# Patient Record
Sex: Male | Born: 1985 | Race: White | Hispanic: No | Smoking: Current every day smoker
Health system: Southern US, Community
[De-identification: ages and names within clinical notes are randomized; demographics above are authoritative.]

## PROBLEM LIST (undated history)

## (undated) DIAGNOSIS — Z8614 Personal history of Methicillin resistant Staphylococcus aureus infection: Secondary | ICD-10-CM

## (undated) DIAGNOSIS — F8081 Childhood onset fluency disorder: Secondary | ICD-10-CM

## (undated) DIAGNOSIS — F909 Attention-deficit hyperactivity disorder, unspecified type: Secondary | ICD-10-CM

## (undated) DIAGNOSIS — F32A Depression, unspecified: Secondary | ICD-10-CM

## (undated) DIAGNOSIS — R51 Headache: Secondary | ICD-10-CM

## (undated) DIAGNOSIS — F419 Anxiety disorder, unspecified: Secondary | ICD-10-CM

## (undated) HISTORY — PX: EYE SURGERY: SHX253

## (undated) HISTORY — PX: NO PAST SURGERIES: SHX2092

---
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
## 2006-08-06 DIAGNOSIS — Z8614 Personal history of Methicillin resistant Staphylococcus aureus infection: Secondary | ICD-10-CM

## 2006-08-06 HISTORY — DX: Personal history of Methicillin resistant Staphylococcus aureus infection: Z86.14

## 2007-05-09 ENCOUNTER — Emergency Department (HOSPITAL_COMMUNITY): Admission: EM | Admit: 2007-05-09 | Discharge: 2007-05-09 | Payer: Self-pay | Admitting: Emergency Medicine

## 2008-08-06 DIAGNOSIS — A159 Respiratory tuberculosis unspecified: Secondary | ICD-10-CM

## 2008-08-06 HISTORY — DX: Respiratory tuberculosis unspecified: A15.9

## 2008-12-24 ENCOUNTER — Emergency Department (HOSPITAL_COMMUNITY): Admission: EM | Admit: 2008-12-24 | Discharge: 2008-12-24 | Payer: Self-pay | Admitting: Emergency Medicine

## 2008-12-26 ENCOUNTER — Emergency Department (HOSPITAL_COMMUNITY): Admission: EM | Admit: 2008-12-26 | Discharge: 2008-12-26 | Payer: Self-pay | Admitting: Emergency Medicine

## 2009-03-17 ENCOUNTER — Emergency Department (HOSPITAL_COMMUNITY): Admission: EM | Admit: 2009-03-17 | Discharge: 2009-03-17 | Payer: Self-pay | Admitting: Emergency Medicine

## 2009-05-28 ENCOUNTER — Encounter: Payer: Self-pay | Admitting: Orthopedic Surgery

## 2009-05-28 ENCOUNTER — Emergency Department (HOSPITAL_COMMUNITY): Admission: EM | Admit: 2009-05-28 | Discharge: 2009-05-28 | Payer: Self-pay | Admitting: Emergency Medicine

## 2009-05-30 ENCOUNTER — Ambulatory Visit: Payer: Self-pay | Admitting: Orthopedic Surgery

## 2009-05-30 DIAGNOSIS — S43109A Unspecified dislocation of unspecified acromioclavicular joint, initial encounter: Secondary | ICD-10-CM | POA: Insufficient documentation

## 2009-05-30 DIAGNOSIS — S40019A Contusion of unspecified shoulder, initial encounter: Secondary | ICD-10-CM | POA: Insufficient documentation

## 2009-06-20 ENCOUNTER — Ambulatory Visit: Payer: Self-pay | Admitting: Orthopedic Surgery

## 2009-08-03 ENCOUNTER — Encounter: Payer: Self-pay | Admitting: Orthopedic Surgery

## 2009-10-12 ENCOUNTER — Emergency Department (HOSPITAL_COMMUNITY): Admission: EM | Admit: 2009-10-12 | Discharge: 2009-10-12 | Payer: Self-pay | Admitting: Emergency Medicine

## 2010-06-03 ENCOUNTER — Emergency Department (HOSPITAL_COMMUNITY): Admission: EM | Admit: 2010-06-03 | Discharge: 2010-06-03 | Payer: Self-pay | Admitting: Emergency Medicine

## 2010-08-27 ENCOUNTER — Encounter: Payer: Self-pay | Admitting: Orthopedic Surgery

## 2010-12-22 NOTE — Discharge Summary (Signed)
[facility]  Patient:    Austin Bryant, Austin Bryant                     MRN: [id number] Adm. Date:  [id number] Disc. Date: [id number] Attending:  Jasmine Pang                           Discharge Summary  NO TEXT DD:  08/12/00 TD:  08/12/00 Job: [phone] [id number]
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
## 2010-12-22 NOTE — Discharge Summary (Signed)
[facility]  Patient:    Austin Bryant, Austin Bryant                     MRN: [id number] Adm. Date:  [id number] Disc. Date: [id number] Attending:  Milford Cage H                           Discharge Summary  REASON FOR ADMISSION:  The patient is a 25 year old male who was admitted after reportedly making threats to kill his family members.  He states that he did not mean to harm them, but they took it seriously.  For further psychiatric admission information, see Psychiatric Admission Assessment.  PHYSICAL EXAMINATION:  No abnormal physical findings.  LABORATORY DATA:  Laboratories in the chart included a hepatic profile which was within normal limits, a TSH which was within normal limits, a free T4 which was grossly within normal limits, and a urinalysis which was within normal limits.  HOSPITAL COURSE:  Upon admission Austin Bryant was given Vistaril 50 mg as a p.r.n. dose if needed for insomnia.  Otherwise, he was not initially placed on any medications.  On the day of discharge his mother expressed some concern about him leaving the hospital without medicine to help his aggressive behaviors. At that point the was started on Zyprexa 2.5 mg p.o. q.h.s.  He participated appropriately in unit therapeutic group and activities and was not a significant behavioral problem while on the unit.  His mother attended a family session for further guidance as to where to proceed from here.  She will continue to seek help at the [facility] for her son.  DISCHARGE MENTAL STATUS EXAMINATION:  The patients mood had improved.  He was not depressed or anxious.  He was angry or irritable.  There was no suicidal or homicidal ideation.  There was no psychosis.  DISCHARGE DIAGNOSES: Axis I:    1. Mood disorder, not otherwise specified.            2. Disruptive behavior disorder, not otherwise specified.            3. ADHD, combined type. Axis II:    None. Axis III:  Healthy. Axis IV:   Healthy. Axis V:    Global Assessment of Functioning is 60/50.  DISCHARGE MEDICATION:  Zyprexa 2.5 mg p.o. q.h.s.  POSTHOSPITAL CARE PLANS:  The patient will return home to live with family.  DISCHARGE FOLLOWUP:  Follow-up therapy and medication management will be at the [facility] with Lynden Ang, M.D. and Faythe Ghee, therapist (to see Austin Bryant on August 08, 2000, at 1:30 p.m.).  DISCHARGE INSTRUCTIONS:  Diet:  No restrictions.  Activity level:  No restrictions. DD:  08/12/00 TD:  08/12/00 Job: [id number]

## 2011-09-09 ENCOUNTER — Emergency Department (HOSPITAL_COMMUNITY)
Admission: EM | Admit: 2011-09-09 | Discharge: 2011-09-10 | Disposition: A | Discharge status: Home / Self Care | Payer: Self-pay | Attending: Emergency Medicine | Admitting: Emergency Medicine

## 2011-09-09 ENCOUNTER — Encounter (HOSPITAL_COMMUNITY): Payer: Self-pay | Admitting: *Deleted

## 2011-09-09 DIAGNOSIS — R109 Unspecified abdominal pain: Secondary | ICD-10-CM | POA: Insufficient documentation

## 2011-09-09 DIAGNOSIS — R3911 Hesitancy of micturition: Secondary | ICD-10-CM | POA: Insufficient documentation

## 2011-09-09 DIAGNOSIS — R1030 Lower abdominal pain, unspecified: Secondary | ICD-10-CM

## 2011-09-09 DIAGNOSIS — R300 Dysuria: Secondary | ICD-10-CM | POA: Insufficient documentation

## 2011-09-09 NOTE — ED Notes (Addendum)
Pt reports x2 days he has been experiencing dysuria - pt states today he began having rt groin pain that radiates to his penis. Pt states pain has gotten progressively worse. Pt denies any n/v/d, penile d/c or fever. Pt guarding rt groin and grimacing during assessment. Pt admits to unprotected sex w/ only one partner.

## 2011-09-09 NOTE — ED Notes (Signed)
C/o burning with urination x 2 days and blood in urine; reports onset of RLQ pain tonight.

## 2011-09-10 ENCOUNTER — Emergency Department (HOSPITAL_COMMUNITY): Payer: Self-pay

## 2011-09-10 LAB — CBC
MCH: 32.5 pg (ref 26.0–34.0)
MCHC: 33.7 g/dL (ref 30.0–36.0)
MCV: 96.7 fL (ref 78.0–100.0)
Platelets: 232 10*3/uL (ref 150–400)
RBC: 4.24 MIL/uL (ref 4.22–5.81)
RDW: 12.6 % (ref 11.5–15.5)
WBC: 8.9 10*3/uL (ref 4.0–10.5)

## 2011-09-10 LAB — URINALYSIS, ROUTINE W REFLEX MICROSCOPIC
Bilirubin Urine: NEGATIVE
Glucose, UA: NEGATIVE mg/dL
Hgb urine dipstick: NEGATIVE
Leukocytes, UA: NEGATIVE
Nitrite: NEGATIVE
Protein, ur: NEGATIVE mg/dL
Specific Gravity, Urine: 1.025 (ref 1.005–1.030)
Urobilinogen, UA: 0.2 mg/dL (ref 0.0–1.0)
pH: 7 (ref 5.0–8.0)

## 2011-09-10 LAB — BASIC METABOLIC PANEL
BUN: 14 mg/dL (ref 6–23)
Chloride: 102 mEq/L (ref 96–112)
Creatinine, Ser: 0.92 mg/dL (ref 0.50–1.35)
GFR calc Af Amer: >90 mL/min (ref >90)
GFR calc non Af Amer: >90 mL/min (ref >90)
Potassium: 4 mEq/L (ref 3.5–5.1)
Sodium: 136 mEq/L (ref 135–145)

## 2011-09-10 MED ORDER — HYDROMORPHONE HCL PF 2 MG/ML IJ SOLN
INTRAMUSCULAR | Status: AC
  Filled 2011-09-10: qty 1

## 2011-09-10 MED ORDER — IBUPROFEN 800 MG PO TABS
800.0000 mg | ORAL_TABLET | Freq: Once | ORAL | Status: AC
  Administered 2011-09-10: 800 mg via ORAL
  Filled 2011-09-10: qty 1

## 2011-09-10 MED ORDER — HYDROMORPHONE HCL PF 1 MG/ML IJ SOLN
1.0000 mg | Freq: Once | INTRAMUSCULAR | Status: AC
  Administered 2011-09-10: 1 mg via INTRAVENOUS
  Filled 2011-09-10: qty 1

## 2011-09-10 MED ORDER — ONDANSETRON HCL 4 MG/2ML IJ SOLN
4.0000 mg | Freq: Once | INTRAMUSCULAR | Status: AC
  Administered 2011-09-10: 4 mg via INTRAVENOUS
  Filled 2011-09-10: qty 2

## 2011-09-10 MED ORDER — HYDROMORPHONE HCL PF 2 MG/ML IJ SOLN: 2.0000 mg | Freq: Once | INTRAMUSCULAR | Status: DC

## 2011-09-10 MED ORDER — NAPROXEN 500 MG PO TABS: 500.0000 mg | ORAL_TABLET | Freq: Two times a day (BID) | ORAL | Status: DC

## 2011-09-10 MED ORDER — HYDROCODONE-ACETAMINOPHEN 5-325 MG PO TABS: 1.0000 | ORAL_TABLET | Freq: Four times a day (QID) | ORAL | Status: DC | PRN

## 2011-09-10 MED ORDER — SODIUM CHLORIDE 0.9 % IV SOLN
INTRAVENOUS | Status: DC
  Administered 2011-09-10: 02:00:00 via INTRAVENOUS

## 2011-09-10 NOTE — ED Notes (Signed)
Rx given x2 D/c instructions reviewed w/ pt - pt denies any further questions or concerns at present.   

## 2011-09-10 NOTE — ED Provider Notes (Addendum)
History     CSN: [id number]  Arrival date & time 09/09/11  2307   First MD Initiated Contact with Patient 09/09/11 2340      Chief Complaint  Patient presents with  . Dysuria    (Consider location/radiation/quality/duration/timing/severity/associated sxs/prior treatment) Patient is a 26 y.o. male presenting with dysuria. The history is provided by the patient.  Dysuria  This is a new problem. The current episode started 2 days ago. The problem has been rapidly worsening. The quality of the pain is described as burning and stabbing. The pain is at a severity of 10/10. The pain is moderate. There has been no fever. He is sexually active. There is no history of pyelonephritis. Associated symptoms include hesitancy. Pertinent negatives include no chills, no nausea, no vomiting, no discharge, no frequency, no hematuria and no flank pain.   Also onset of dysuria 2 days ago and acute onset tonight of right-sided groin pain. No abnormal pain or flank pain no hematuria he is sexually active denies any discharge or lesions. No testicular pain. History reviewed. No pertinent past medical history.  History reviewed. No pertinent past surgical history.  No family history on file.  History  Substance Use Topics  . Smoking status: Former Games developer  . Smokeless tobacco: Not on file  . Alcohol Use: No      Review of Systems  Constitutional: Negative for fever and chills.  HENT: Negative for congestion, neck pain and neck stiffness.   Eyes: Negative for redness.  Respiratory: Negative for cough and shortness of breath.   Cardiovascular: Negative for chest pain.  Gastrointestinal: Positive for abdominal pain. Negative for nausea and vomiting.  Genitourinary: Positive for dysuria and hesitancy. Negative for frequency, hematuria, flank pain, discharge, scrotal swelling and testicular pain.  Musculoskeletal: Negative for back pain.  Skin: Negative for rash.  Neurological: Negative for headaches.    Hematological: Does not bruise/bleed easily.    Allergies  Penicillins  Home Medications   Current Outpatient Rx  Name Route Sig Dispense Refill  . IBUPROFEN 200 MG PO TABS Oral Take 200 mg by mouth every 6 (six) hours as needed.    Marland Kitchen HYDROCODONE-ACETAMINOPHEN 5-325 MG PO TABS Oral Take 1-2 tablets by mouth every 6 (six) hours as needed for pain. 10 tablet 0  . NAPROXEN 500 MG PO TABS Oral Take 1 tablet (500 mg total) by mouth 2 (two) times daily. 14 tablet 0    BP 137/86  Pulse 96  Temp(Src) 98.1 F (36.7 C) (Oral)  Resp 20  Ht 6\' 3"  (1.905 m)  Wt 246 lb (111.585 kg)  BMI 30.75 kg/m2  SpO2 98%  Physical Exam  Nursing note and vitals reviewed. Constitutional: He is oriented to person, place, and time. He appears well-developed and well-nourished. No distress.  HENT:  Head: Normocephalic and atraumatic.  Mouth/Throat: Oropharynx is clear and moist.  Eyes: Conjunctivae are normal. Pupils are equal, round, and reactive to light.  Neck: Normal range of motion. Neck supple.  Cardiovascular: Normal rate, regular rhythm and normal heart sounds.   No murmur heard. Pulmonary/Chest: Effort normal and breath sounds normal.  Abdominal: Soft. Bowel sounds are normal. He exhibits no mass. There is no tenderness.  Genitourinary: Penis normal.       No penile discharge no lesions no testicular mass no scrotal swelling no epididymal tenderness no groin hernia  Musculoskeletal: Normal range of motion. He exhibits no edema.  Neurological: He is alert and oriented to person, place, and time. No cranial  nerve deficit. He exhibits normal muscle tone. Coordination normal.  Skin: Skin is warm. No rash noted.    ED Course  Procedures (including critical care time)   Labs Reviewed  CBC  BASIC METABOLIC PANEL  URINALYSIS, ROUTINE W REFLEX MICROSCOPIC  URINALYSIS, WITH MICROSCOPIC   Ct Abdomen Pelvis Wo Contrast  09/10/2011  *RADIOLOGY REPORT*  Clinical Data: Dysuria  CT ABDOMEN AND  PELVIS WITHOUT CONTRAST  Technique:  Multidetector CT imaging of the abdomen and pelvis was performed following the standard protocol without intravenous contrast.  Comparison: None.  Findings: Limited images through the lung bases demonstrate no significant appreciable abnormality. The heart size is within normal limits. No pleural or pericardial effusion.  Intra-abdominal organ evaluation is limited without intravenous contrast.  Within this limitation, unremarkable liver, biliary system, spleen, pancreas, adrenal glands.  Symmetric renal size.  No hydronephrosis or hydroureter.  No urinary tract calculi identified.  No bowel obstruction.  No CT evidence for colitis.  Normal appendix.  No free intraperitoneal air or fluid.  No lymphadenopathy.  Normal caliber vasculature.  Decompressed bladder.  There is a round metallic density posterior to the L4 vertebral body.  No acute osseous abnormality identified.  IMPRESSION: No hydronephrosis or urinary tract calculi identified.  Normal appendix.  Original Report Authenticated By: Waneta Martins, M.D.   Results for orders placed during the hospital encounter of 09/09/11  CBC      Component Value Range   WBC 8.9  4.0 - 10.5 (K/uL)   RBC 4.24  4.22 - 5.81 (MIL/uL)   Hemoglobin 13.8  13.0 - 17.0 (g/dL)   HCT 08.6  57.8 - 46.9 (%)   MCV 96.7  78.0 - 100.0 (fL)   MCH 32.5  26.0 - 34.0 (pg)   MCHC 33.7  30.0 - 36.0 (g/dL)   RDW 62.9  52.8 - 41.3 (%)   Platelets 232  150 - 400 (K/uL)  BASIC METABOLIC PANEL      Component Value Range   Sodium 136  135 - 145 (mEq/L)   Potassium 4.0  3.5 - 5.1 (mEq/L)   Chloride 102  96 - 112 (mEq/L)   CO2 26  19 - 32 (mEq/L)   Glucose, Bld 97  70 - 99 (mg/dL)   BUN 14  6 - 23 (mg/dL)   Creatinine, Ser 2.44  0.50 - 1.35 (mg/dL)   Calcium 9.4  8.4 - 01.0 (mg/dL)   GFR calc non Af Amer >90  >90 (mL/min)   GFR calc Af Amer >90  >90 (mL/min)  URINALYSIS, ROUTINE W REFLEX MICROSCOPIC      Component Value Range   Color,  Urine YELLOW  YELLOW    APPearance CLEAR  CLEAR    Specific Gravity, Urine 1.025  1.005 - 1.030    pH 7.0  5.0 - 8.0    Glucose, UA NEGATIVE  NEGATIVE (mg/dL)   Hgb urine dipstick NEGATIVE  NEGATIVE    Bilirubin Urine NEGATIVE  NEGATIVE    Ketones, ur NEGATIVE  NEGATIVE (mg/dL)   Protein, ur NEGATIVE  NEGATIVE (mg/dL)   Urobilinogen, UA 0.2  0.0 - 1.0 (mg/dL)   Nitrite NEGATIVE  NEGATIVE    Leukocytes, UA NEGATIVE  NEGATIVE      1. Groin pain       MDM  Vision with complaint of bilateral groin pain mostly on the right side. Associated with dysuria started 2 days ago got worse this evening. No history of kidney stones are similar problem. Denies any  trauma or injury. CT scan negative for any abnormalities including renal stones or hernia. Based on exam no discharge so unlikely STD also testicular exam with no masses no epididymal tenderness. No evidence of testicular torsion. No leukocytosis. Should etiology the pain is not clear will recheck patient just to be sure no change in the testicular exam.        Shelda Jakes, MD 09/10/11 1191  Shelda Jakes, MD 09/10/11 [id number]

## 2011-09-15 ENCOUNTER — Emergency Department (HOSPITAL_COMMUNITY)
Admission: EM | Admit: 2011-09-15 | Discharge: 2011-09-15 | Disposition: A | Discharge status: Home / Self Care | Payer: Self-pay | Attending: Emergency Medicine | Admitting: Emergency Medicine

## 2011-09-15 ENCOUNTER — Emergency Department (HOSPITAL_COMMUNITY): Payer: Self-pay

## 2011-09-15 ENCOUNTER — Encounter (HOSPITAL_COMMUNITY): Payer: Self-pay | Admitting: *Deleted

## 2011-09-15 DIAGNOSIS — S40019A Contusion of unspecified shoulder, initial encounter: Secondary | ICD-10-CM | POA: Insufficient documentation

## 2011-09-15 DIAGNOSIS — S60222A Contusion of left hand, initial encounter: Secondary | ICD-10-CM

## 2011-09-15 DIAGNOSIS — M79609 Pain in unspecified limb: Secondary | ICD-10-CM | POA: Insufficient documentation

## 2011-09-15 DIAGNOSIS — S40012A Contusion of left shoulder, initial encounter: Secondary | ICD-10-CM

## 2011-09-15 DIAGNOSIS — S60229A Contusion of unspecified hand, initial encounter: Secondary | ICD-10-CM | POA: Insufficient documentation

## 2011-09-15 DIAGNOSIS — M25519 Pain in unspecified shoulder: Secondary | ICD-10-CM | POA: Insufficient documentation

## 2011-09-15 MED ORDER — TRAMADOL HCL 50 MG PO TABS: 50.0000 mg | ORAL_TABLET | Freq: Four times a day (QID) | ORAL | Status: AC | PRN

## 2011-09-15 MED ORDER — KETOROLAC TROMETHAMINE 60 MG/2ML IM SOLN
60.0000 mg | Freq: Once | INTRAMUSCULAR | Status: AC
  Administered 2011-09-15: 60 mg via INTRAMUSCULAR
  Filled 2011-09-15: qty 2
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
## 2011-09-15 NOTE — ED Notes (Signed)
Pt c/o left shoulder and hand pain.

## 2011-10-16 ENCOUNTER — Emergency Department (HOSPITAL_COMMUNITY): Payer: Self-pay

## 2011-10-16 ENCOUNTER — Emergency Department (HOSPITAL_COMMUNITY)
Admission: EM | Admit: 2011-10-16 | Discharge: 2011-10-16 | Disposition: A | Discharge status: Home Health | Payer: Self-pay | Attending: Emergency Medicine | Admitting: Emergency Medicine

## 2011-10-16 ENCOUNTER — Encounter (HOSPITAL_COMMUNITY): Payer: Self-pay | Admitting: Emergency Medicine

## 2011-10-16 DIAGNOSIS — H538 Other visual disturbances: Secondary | ICD-10-CM | POA: Insufficient documentation

## 2011-10-16 DIAGNOSIS — S01119A Laceration without foreign body of unspecified eyelid and periocular area, initial encounter: Secondary | ICD-10-CM | POA: Insufficient documentation

## 2011-10-16 DIAGNOSIS — H1130 Conjunctival hemorrhage, unspecified eye: Secondary | ICD-10-CM | POA: Insufficient documentation

## 2011-10-16 DIAGNOSIS — S0500XA Injury of conjunctiva and corneal abrasion without foreign body, unspecified eye, initial encounter: Secondary | ICD-10-CM

## 2011-10-16 DIAGNOSIS — R51 Headache: Secondary | ICD-10-CM | POA: Insufficient documentation

## 2011-10-16 DIAGNOSIS — W34010A Accidental discharge of airgun, initial encounter: Secondary | ICD-10-CM | POA: Insufficient documentation

## 2011-10-16 DIAGNOSIS — S058X9A Other injuries of unspecified eye and orbit, initial encounter: Secondary | ICD-10-CM | POA: Insufficient documentation

## 2011-10-16 DIAGNOSIS — T148XXA Other injury of unspecified body region, initial encounter: Secondary | ICD-10-CM

## 2011-10-16 MED ORDER — HYDROMORPHONE HCL PF 1 MG/ML IJ SOLN
1.0000 mg | Freq: Once | INTRAMUSCULAR | Status: AC
  Administered 2011-10-16: 1 mg via INTRAVENOUS
  Filled 2011-10-16: qty 1

## 2011-10-16 MED ORDER — FLUORESCEIN SODIUM 1 MG OP STRP
1.0000 | ORAL_STRIP | Freq: Once | OPHTHALMIC | Status: AC
  Administered 2011-10-16: 1 via OPHTHALMIC
  Filled 2011-10-16: qty 1

## 2011-10-16 MED ORDER — OXYCODONE-ACETAMINOPHEN 5-325 MG PO TABS: 1.0000 | ORAL_TABLET | ORAL | Status: AC | PRN

## 2011-10-16 MED ORDER — TETRACAINE HCL 0.5 % OP SOLN
1.0000 [drp] | Freq: Once | OPHTHALMIC | Status: AC
  Administered 2011-10-16: 1 [drp] via OPHTHALMIC
  Filled 2011-10-16 (×2): qty 2

## 2011-10-16 MED ORDER — ONDANSETRON HCL 4 MG/2ML IJ SOLN
4.0000 mg | Freq: Once | INTRAMUSCULAR | Status: AC
  Administered 2011-10-16: 4 mg via INTRAVENOUS
  Filled 2011-10-16: qty 2

## 2011-10-16 MED ORDER — CLINDAMYCIN HCL 150 MG PO CAPS: 150.0000 mg | ORAL_CAPSULE | Freq: Four times a day (QID) | ORAL | Status: AC

## 2011-10-16 MED ORDER — HOMATROPINE HBR 2 % OP SOLN
1.0000 [drp] | Freq: Once | OPHTHALMIC | Status: AC
  Administered 2011-10-16: 1 [drp] via OPHTHALMIC

## 2011-10-16 MED ORDER — CLINDAMYCIN PHOSPHATE 300 MG/50ML IV SOLN
300.0000 mg | Freq: Once | INTRAVENOUS | Status: AC
  Administered 2011-10-16: 300 mg via INTRAVENOUS
  Filled 2011-10-16 (×2): qty 50

## 2011-10-16 MED ORDER — ERYTHROMYCIN 5 MG/GM OP OINT
TOPICAL_OINTMENT | Freq: Four times a day (QID) | OPHTHALMIC | Status: DC
  Administered 2011-10-16: 23:00:00 via OPHTHALMIC
  Filled 2011-10-16: qty 3.5

## 2011-10-16 MED ORDER — HOMATROPINE HBR 5 % OP SOLN
1.0000 [drp] | Freq: Two times a day (BID) | OPHTHALMIC | Status: DC
  Filled 2011-10-16: qty 5
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
## 2011-10-16 NOTE — ED Notes (Signed)
Requesting more pain medication. Will notify MD

## 2011-10-16 NOTE — Discharge Instructions (Signed)
Corneal Abrasion The cornea is the clear covering at the front and center of the eye. It is a thin tissue made up of layers. The top layer is the most sensitive layer. A corneal abrasion happens if this layer is scratched or an injury causes it to come off.  HOME CARE  You may be given drops or a medicated cream. Use the medicine as told by your doctor.   A pressure patch may be put over the eye. If this is done, follow your doctor's instructions for when to remove the patch. Do not drive or use machines while the eye patch is on. Judging distances is hard to do with a patch on.   See your doctor for a follow-up exam if you are told to do so.  GET HELP RIGHT AWAY IF:   The pain is getting worse or is very bad.   The eye is very sensitive to light.   Any liquid comes out of the injured eye after treatment.   Your vision suddenly gets worse.   You have a sudden loss of vision or blindness.  MAKE SURE YOU:   Understand these instructions.   Will watch your condition.   Will get help right away if you are not doing well or get worse.  Document Released: 01/09/2008 Document Revised: 07/12/2011 Document Reviewed: 01/09/2008 Exit[employer] Patient Information 2012 [employer]. 
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
## 2011-10-19 ENCOUNTER — Encounter (HOSPITAL_COMMUNITY)
Admission: RE | Disposition: A | Discharge status: Home / Self Care | Payer: Self-pay | Source: Ambulatory Visit | Attending: Ophthalmology

## 2011-10-19 ENCOUNTER — Emergency Department (HOSPITAL_COMMUNITY)
Admission: EM | Admit: 2011-10-19 | Discharge: 2011-10-19 | Disposition: A | Discharge status: Home / Self Care | Payer: Self-pay

## 2011-10-19 ENCOUNTER — Encounter (HOSPITAL_COMMUNITY): Payer: Self-pay

## 2011-10-19 ENCOUNTER — Ambulatory Visit (HOSPITAL_COMMUNITY)
Admission: RE | Admit: 2011-10-19 | Discharge: 2011-10-19 | Disposition: A | Discharge status: Home / Self Care | Payer: Self-pay | Source: Ambulatory Visit | Attending: Ophthalmology | Admitting: Ophthalmology

## 2011-10-19 ENCOUNTER — Encounter (HOSPITAL_COMMUNITY): Payer: Self-pay | Admitting: Anesthesiology

## 2011-10-19 ENCOUNTER — Emergency Department (HOSPITAL_COMMUNITY)
Admission: EM | Admit: 2011-10-19 | Discharge: 2011-10-19 | Discharge status: Against Medical Advice | Payer: Self-pay | Attending: Emergency Medicine | Admitting: Emergency Medicine

## 2011-10-19 ENCOUNTER — Encounter (HOSPITAL_COMMUNITY)
Admission: RE | Admit: 2011-10-19 | Discharge: 2011-10-19 | Disposition: A | Discharge status: Home / Self Care | Payer: Self-pay | Source: Ambulatory Visit | Attending: Ophthalmology | Admitting: Ophthalmology

## 2011-10-19 ENCOUNTER — Ambulatory Visit (HOSPITAL_COMMUNITY): Payer: Self-pay | Admitting: Anesthesiology

## 2011-10-19 DIAGNOSIS — G8918 Other acute postprocedural pain: Secondary | ICD-10-CM | POA: Insufficient documentation

## 2011-10-19 DIAGNOSIS — X58XXXA Exposure to other specified factors, initial encounter: Secondary | ICD-10-CM | POA: Insufficient documentation

## 2011-10-19 DIAGNOSIS — F411 Generalized anxiety disorder: Secondary | ICD-10-CM | POA: Insufficient documentation

## 2011-10-19 DIAGNOSIS — F17200 Nicotine dependence, unspecified, uncomplicated: Secondary | ICD-10-CM | POA: Insufficient documentation

## 2011-10-19 DIAGNOSIS — H5710 Ocular pain, unspecified eye: Secondary | ICD-10-CM | POA: Insufficient documentation

## 2011-10-19 DIAGNOSIS — Z01812 Encounter for preprocedural laboratory examination: Secondary | ICD-10-CM | POA: Insufficient documentation

## 2011-10-19 DIAGNOSIS — Z8614 Personal history of Methicillin resistant Staphylococcus aureus infection: Secondary | ICD-10-CM | POA: Insufficient documentation

## 2011-10-19 DIAGNOSIS — F909 Attention-deficit hyperactivity disorder, unspecified type: Secondary | ICD-10-CM | POA: Insufficient documentation

## 2011-10-19 DIAGNOSIS — S01119A Laceration without foreign body of unspecified eyelid and periocular area, initial encounter: Secondary | ICD-10-CM | POA: Insufficient documentation

## 2011-10-19 HISTORY — DX: Anxiety disorder, unspecified: F41.9

## 2011-10-19 HISTORY — DX: Headache: R51

## 2011-10-19 HISTORY — PX: LACRIMAL TUBE INSERTION: SHX1905

## 2011-10-19 HISTORY — DX: Attention-deficit hyperactivity disorder, unspecified type: F90.9

## 2011-10-19 HISTORY — DX: Personal history of Methicillin resistant Staphylococcus aureus infection: Z86.14

## 2011-10-19 LAB — BASIC METABOLIC PANEL
BUN: 16 mg/dL (ref 6–23)
Chloride: 102 mEq/L (ref 96–112)
GFR calc Af Amer: >90 mL/min (ref >90)
Potassium: 3.9 mEq/L (ref 3.5–5.1)

## 2011-10-19 LAB — HEMOGLOBIN AND HEMATOCRIT, BLOOD
HCT: 42.3 % (ref 39.0–52.0)
Hemoglobin: 14.8 g/dL (ref 13.0–17.0)

## 2011-10-19 SURGERY — INSERTION, LACRIMAL TUBE
Anesthesia: Monitor Anesthesia Care | Site: Eye | Laterality: Right | Wound class: Clean

## 2011-10-19 MED ORDER — LACTATED RINGERS IV SOLN
INTRAVENOUS | Status: DC
  Administered 2011-10-19: 08:00:00 via INTRAVENOUS

## 2011-10-19 MED ORDER — BSS IO SOLN
INTRAOCULAR | Status: DC | PRN
  Administered 2011-10-19: 15 mL via INTRAOCULAR

## 2011-10-19 MED ORDER — LIDOCAINE-EPINEPHRINE (PF) 1 %-1:200000 IJ SOLN
INTRAMUSCULAR | Status: DC | PRN
  Administered 2011-10-19: 1 mL
  Administered 2011-10-19: .5 mL

## 2011-10-19 MED ORDER — NEOMYCIN-POLYMYXIN-DEXAMETH 3.5-10000-0.1 OP OINT
TOPICAL_OINTMENT | OPHTHALMIC | Status: DC | PRN
  Administered 2011-10-19 (×2): 1 via OPHTHALMIC

## 2011-10-19 MED ORDER — PROPOFOL 10 MG/ML IV EMUL
INTRAVENOUS | Status: AC
  Filled 2011-10-19: qty 20

## 2011-10-19 MED ORDER — FENTANYL CITRATE 0.05 MG/ML IJ SOLN
INTRAMUSCULAR | Status: AC
  Administered 2011-10-19: 50 ug via INTRAVENOUS
  Filled 2011-10-19: qty 2

## 2011-10-19 MED ORDER — MIDAZOLAM HCL 2 MG/2ML IJ SOLN
1.0000 mg | INTRAMUSCULAR | Status: DC | PRN
  Administered 2011-10-19: 2 mg via INTRAVENOUS

## 2011-10-19 MED ORDER — FENTANYL CITRATE 0.05 MG/ML IJ SOLN
INTRAMUSCULAR | Status: DC | PRN
  Administered 2011-10-19 (×4): 25 ug via INTRAVENOUS

## 2011-10-19 MED ORDER — NEOMYCIN-POLYMYXIN-DEXAMETH 3.5-10000-0.1 OP OINT
TOPICAL_OINTMENT | OPHTHALMIC | Status: AC
  Filled 2011-10-19: qty 3.5

## 2011-10-19 MED ORDER — FENTANYL CITRATE 0.05 MG/ML IJ SOLN
INTRAMUSCULAR | Status: AC
  Filled 2011-10-19: qty 2

## 2011-10-19 MED ORDER — FENTANYL CITRATE 0.05 MG/ML IJ SOLN
50.0000 ug | INTRAMUSCULAR | Status: DC | PRN
  Administered 2011-10-19: 50 ug via INTRAVENOUS

## 2011-10-19 MED ORDER — LACTATED RINGERS IV SOLN: INTRAVENOUS | Status: DC

## 2011-10-19 MED ORDER — 0.9 % SODIUM CHLORIDE (POUR BTL) OPTIME
TOPICAL | Status: DC | PRN
  Administered 2011-10-19: 1000 mL

## 2011-10-19 MED ORDER — GLYCOPYRROLATE 0.2 MG/ML IJ SOLN
INTRAMUSCULAR | Status: AC
  Filled 2011-10-19: qty 1

## 2011-10-19 MED ORDER — LIDOCAINE-EPINEPHRINE (PF) 1 %-1:200000 IJ SOLN
INTRAMUSCULAR | Status: AC
  Filled 2011-10-19: qty 10

## 2011-10-19 MED ORDER — LIDOCAINE HCL (PF) 1 % IJ SOLN
INTRAMUSCULAR | Status: AC
  Filled 2011-10-19: qty 30

## 2011-10-19 MED ORDER — PROPOFOL 10 MG/ML IV EMUL
INTRAVENOUS | Status: DC | PRN
  Administered 2011-10-19: 11:00:00 via INTRAVENOUS
  Administered 2011-10-19: 100 ug/kg/min via INTRAVENOUS

## 2011-10-19 MED ORDER — GLYCOPYRROLATE 0.2 MG/ML IJ SOLN
0.2000 mg | Freq: Once | INTRAMUSCULAR | Status: AC
  Administered 2011-10-19: 09:00:00 via INTRAVENOUS

## 2011-10-19 MED ORDER — MIDAZOLAM HCL 2 MG/2ML IJ SOLN
INTRAMUSCULAR | Status: AC
  Administered 2011-10-19: 2 mg via INTRAVENOUS
  Filled 2011-10-19: qty 2

## 2011-10-19 SURGICAL SUPPLY — 20 items
8-0 VICRYL ×2 IMPLANT
CLOTH BEACON ORANGE TIMEOUT ST (SAFETY) ×2 IMPLANT
COVER LIGHT HANDLE STERIS (MISCELLANEOUS) ×2 IMPLANT
COVER X RAY CASSETTE (MISCELLANEOUS) ×2 IMPLANT
GAUZE SPONGE 4X4 16PLY XRAY LF (GAUZE/BANDAGES/DRESSINGS) ×2 IMPLANT
GLOVE BIOGEL PI IND STRL 6.5 (GLOVE) ×1 IMPLANT
GLOVE BIOGEL PI IND STRL 7.0 (GLOVE) ×1 IMPLANT
GLOVE BIOGEL PI INDICATOR 6.5 (GLOVE) ×1
GLOVE BIOGEL PI INDICATOR 7.0 (GLOVE) ×1
GLOVE ECLIPSE 6.5 STRL STRAW (GLOVE) ×2 IMPLANT
GLOVE EXAM NITRILE MD LF STRL (GLOVE) ×2 IMPLANT
GOWN SRG XL XLNG 56XLVL 4 (GOWN DISPOSABLE) ×1 IMPLANT
GOWN STRL NON-REIN XL XLG LVL4 (GOWN DISPOSABLE) ×1
KIT BLADEGUARD II DBL (SET/KITS/TRAYS/PACK) ×2 IMPLANT
PAD ARMBOARD 7.5X6 YLW CONV (MISCELLANEOUS) ×2 IMPLANT
PHACO- ~~LOC~~ SIGHTPATH MEDICAL ×2 IMPLANT
PROC W NO LENS (INTRAOCULAR LENS) ×2
PROCESS W NO LENS (INTRAOCULAR LENS) ×1 IMPLANT
TOWEL OR 17X26 4PK STRL BLUE (TOWEL DISPOSABLE) ×2 IMPLANT
WATER STERILE IRR 250ML POUR (IV SOLUTION) ×2 IMPLANT

## 2011-10-19 NOTE — Transfer of Care (Signed)
Immediate Anesthesia Transfer of Care Note  Patient: Austin Bryant  Procedure(s) Performed: Procedure(s) (LRB): LACRIMAL TUBE INSERTION (Right)  Patient Location: Shortstay  Anesthesia Type: MAC  Level of Consciousness: awake  Airway & Oxygen Therapy: Patient Spontanous Breathing   Post-op Assessment: Report given to PACU RN, Post -op Vital signs reviewed and stable and Patient moving all extremities  Post vital signs: Reviewed and stable  Complications: No apparent anesthesia complications

## 2011-10-19 NOTE — Anesthesia Procedure Notes (Signed)
Procedure Name: MAC Date/Time: 10/19/2011 9:55 AM Performed by: Franco Nones Pre-anesthesia Checklist: Patient identified, Emergency Drugs available, Suction available, Timeout performed and Patient being monitored Patient Re-evaluated:Patient Re-evaluated prior to inductionOxygen Delivery Method: Nasal Cannula
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
## 2011-10-19 NOTE — Anesthesia Postprocedure Evaluation (Signed)
  Anesthesia Post-op Note  Patient: Austin Bryant  Procedure(s) Performed: Procedure(s) (LRB): LACRIMAL TUBE INSERTION (Right)  Patient Location:  Short Stay  Anesthesia Type: MAC  Level of Consciousness: awake  Airway and Oxygen Therapy: Patient Spontanous Breathing  Post-op Pain: none  Post-op Assessment: Post-op Vital signs reviewed, Patient's Cardiovascular Status Stable, Respiratory Function Stable, Patent Airway, No signs of Nausea or vomiting and Pain level controlled  Post-op Vital Signs: Reviewed and stable  Complications: No apparent anesthesia complications

## 2011-10-19 NOTE — Discharge Instructions (Signed)
Regular Diet Unlimited activity  Tylenol as needed Use provided Maxitrol Ointment in Right Eye twice a day Do not rub Right eye  Follow up 1 month with Dr. Lita Mains.  Please call to schedule appointment.

## 2011-10-19 NOTE — Anesthesia Preprocedure Evaluation (Signed)
Anesthesia Evaluation  Patient identified by MRN, date of birth, ID band Patient awake    Reviewed: Allergy & Precautions, H&P , NPO status , Patient's Chart, lab work & pertinent test results  Airway Mallampati: I TM Distance: >3 FB Neck ROM: Full    Dental No notable dental hx.    Pulmonary    Pulmonary exam normal       Cardiovascular negative cardio ROS  Rhythm:Regular Rate:Normal     Neuro/Psych  Headaches, PSYCHIATRIC DISORDERS Anxiety    GI/Hepatic negative GI ROS, Neg liver ROS,   Endo/Other  negative endocrine ROS  Renal/GU negative Renal ROS     Musculoskeletal negative musculoskeletal ROS (+)   Abdominal Normal abdominal exam  (+)   Peds  (+) ADHD Hematology negative hematology ROS (+)   Anesthesia Other Findings   Reproductive/Obstetrics                           Anesthesia Physical Anesthesia Plan  ASA: II  Anesthesia Plan: MAC   Post-op Pain Management:    Induction: Intravenous  Airway Management Planned: Nasal Cannula  Additional Equipment:   Intra-op Plan:   Post-operative Plan:   Informed Consent: I have reviewed the patients History and Physical, chart, labs and discussed the procedure including the risks, benefits and alternatives for the proposed anesthesia with the patient or authorized representative who has indicated his/her understanding and acceptance.     Plan Discussed with: CRNA  Anesthesia Plan Comments:         Anesthesia Quick Evaluation
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
## 2011-10-20 NOTE — ED Provider Notes (Signed)
Medical screening examination/treatment/procedure(s) were performed by non-physician practitioner and as supervising physician I was immediately available for consultation/collaboration.  Cheri Guppy, MD 10/20/11 [phone]

## 2011-10-22 NOTE — Addendum Note (Signed)
Addendum  created 10/22/11 0805 by Franco Nones, CRNA   Modules edited:Anesthesia Events, Anesthesia Flowsheet

## 2011-10-22 NOTE — H&P (Signed)
See scanned H&P from office dated 10/18/2011

## 2011-10-22 NOTE — Op Note (Signed)
See scanned op note done on another system on day of surgery

## 2011-10-23 ENCOUNTER — Encounter (HOSPITAL_COMMUNITY): Payer: Self-pay | Admitting: Ophthalmology

## 2011-10-27 ENCOUNTER — Emergency Department (HOSPITAL_COMMUNITY): Payer: Self-pay

## 2011-10-27 ENCOUNTER — Emergency Department (HOSPITAL_COMMUNITY)
Admission: EM | Admit: 2011-10-27 | Discharge: 2011-10-28 | Disposition: A | Discharge status: Home / Self Care | Payer: Self-pay | Attending: Emergency Medicine | Admitting: Emergency Medicine

## 2011-10-27 ENCOUNTER — Encounter (HOSPITAL_COMMUNITY): Payer: Self-pay | Admitting: Emergency Medicine

## 2011-10-27 DIAGNOSIS — M79609 Pain in unspecified limb: Secondary | ICD-10-CM | POA: Insufficient documentation

## 2011-10-27 DIAGNOSIS — IMO0002 Reserved for concepts with insufficient information to code with codable children: Secondary | ICD-10-CM | POA: Insufficient documentation

## 2011-10-27 DIAGNOSIS — S8012XA Contusion of left lower leg, initial encounter: Secondary | ICD-10-CM

## 2011-10-27 DIAGNOSIS — F909 Attention-deficit hyperactivity disorder, unspecified type: Secondary | ICD-10-CM | POA: Insufficient documentation

## 2011-10-27 DIAGNOSIS — S8010XA Contusion of unspecified lower leg, initial encounter: Secondary | ICD-10-CM | POA: Insufficient documentation

## 2011-10-27 MED ORDER — BACITRA-NEOMYCIN-POLYMYXIN-HC 1 % OP OINT: TOPICAL_OINTMENT | Freq: Two times a day (BID) | OPHTHALMIC | Status: AC

## 2011-10-27 NOTE — ED Notes (Signed)
Pt returned to room before cleaning.

## 2011-10-27 NOTE — ED Notes (Signed)
Received bedside report from [place], [place].  Patient currently resting quietly in bed; no respiratory or acute distress noted.  Patient updated on plan of care; informed patient that he will be taken to x-ray for films.  Patient has no other questions or concerns at this time; will continue to monitor.
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
## 2011-10-28 MED ORDER — KETOROLAC TROMETHAMINE 30 MG/ML IJ SOLN: 60.0000 mg | Freq: Once | INTRAMUSCULAR | Status: DC

## 2011-10-28 MED ORDER — KETOROLAC TROMETHAMINE 60 MG/2ML IM SOLN
INTRAMUSCULAR | Status: AC
  Administered 2011-10-28: 60 mg via INTRAMUSCULAR
  Filled 2011-10-28: qty 2

## 2011-10-28 NOTE — ED Notes (Signed)
Patient given discharge paperwork; went over discharge instructions with patient.  Patient instructed to take antibiotics as prescribed, to follow up with primary care physician and referrals (especially about eye drops), to apply antibiotic ointment to abrasions, and to return to the ED for new, worsening, or concerning symptoms.

## 2011-10-28 NOTE — ED Notes (Signed)
Patient currently sitting up in bed; no respiratory or acute distress noted.  Patient requesting pain medication; Dr. Hyacinth Meeker notified.  Patient has no other questions or concerns at this time; informed patient that EDP is currently working on discharge paperwork; will continue to monitor.

## 2011-10-28 NOTE — Discharge Instructions (Signed)
Your xrays are normal - use topical antibiotic cream to your abrasions, clean well twice a day - use the antibiotic drops in your eyes as prescribed - call your doctor in the morning to get the other drops ;you need.  Ibuprofen for pain

## 2012-06-20 ENCOUNTER — Emergency Department (HOSPITAL_COMMUNITY): Payer: Self-pay

## 2012-06-20 ENCOUNTER — Emergency Department (HOSPITAL_COMMUNITY)
Admission: EM | Admit: 2012-06-20 | Discharge: 2012-06-20 | Disposition: A | Discharge status: Home / Self Care | Payer: Self-pay | Attending: Emergency Medicine | Admitting: Emergency Medicine

## 2012-06-20 DIAGNOSIS — N509 Disorder of male genital organs, unspecified: Secondary | ICD-10-CM | POA: Insufficient documentation

## 2012-06-20 DIAGNOSIS — Z791 Long term (current) use of non-steroidal anti-inflammatories (NSAID): Secondary | ICD-10-CM | POA: Insufficient documentation

## 2012-06-20 DIAGNOSIS — R109 Unspecified abdominal pain: Secondary | ICD-10-CM | POA: Insufficient documentation

## 2012-06-20 DIAGNOSIS — Z8614 Personal history of Methicillin resistant Staphylococcus aureus infection: Secondary | ICD-10-CM | POA: Insufficient documentation

## 2012-06-20 DIAGNOSIS — R112 Nausea with vomiting, unspecified: Secondary | ICD-10-CM | POA: Insufficient documentation

## 2012-06-20 DIAGNOSIS — R300 Dysuria: Secondary | ICD-10-CM | POA: Insufficient documentation

## 2012-06-20 DIAGNOSIS — F17200 Nicotine dependence, unspecified, uncomplicated: Secondary | ICD-10-CM | POA: Insufficient documentation

## 2012-06-20 DIAGNOSIS — F909 Attention-deficit hyperactivity disorder, unspecified type: Secondary | ICD-10-CM | POA: Insufficient documentation

## 2012-06-20 DIAGNOSIS — N390 Urinary tract infection, site not specified: Secondary | ICD-10-CM | POA: Insufficient documentation

## 2012-06-20 DIAGNOSIS — M549 Dorsalgia, unspecified: Secondary | ICD-10-CM | POA: Insufficient documentation

## 2012-06-20 DIAGNOSIS — R51 Headache: Secondary | ICD-10-CM | POA: Insufficient documentation

## 2012-06-20 DIAGNOSIS — F411 Generalized anxiety disorder: Secondary | ICD-10-CM | POA: Insufficient documentation

## 2012-06-20 LAB — URINALYSIS, ROUTINE W REFLEX MICROSCOPIC
Leukocytes, UA: NEGATIVE
Nitrite: NEGATIVE
Protein, ur: NEGATIVE mg/dL
Specific Gravity, Urine: 1.014 (ref 1.005–1.030)
Urobilinogen, UA: 0.2 mg/dL (ref 0.0–1.0)

## 2012-06-20 LAB — COMPREHENSIVE METABOLIC PANEL
ALT: 21 U/L (ref 0–53)
AST: 18 U/L (ref 0–37)
Alkaline Phosphatase: 56 U/L (ref 39–117)
CO2: 27 mEq/L (ref 19–32)
Calcium: 9.5 mg/dL (ref 8.4–10.5)
GFR calc Af Amer: >90 mL/min (ref >90)
Glucose, Bld: 80 mg/dL (ref 70–99)
Potassium: 4.1 mEq/L (ref 3.5–5.1)
Sodium: 139 mEq/L (ref 135–145)
Total Protein: 6.8 g/dL (ref 6.0–8.3)

## 2012-06-20 LAB — CBC WITH DIFFERENTIAL/PLATELET
Basophils Absolute: 0 10*3/uL (ref 0.0–0.1)
Eosinophils Absolute: 0.2 10*3/uL (ref 0.0–0.7)
Eosinophils Relative: 3 % (ref 0–5)
Lymphocytes Relative: 55 % — ABNORMAL HIGH (ref 12–46)
Lymphs Abs: 4.3 10*3/uL — ABNORMAL HIGH (ref 0.7–4.0)
MCV: 97.3 fL (ref 78.0–100.0)
Neutrophils Relative %: 37 % — ABNORMAL LOW (ref 43–77)
Platelets: 236 10*3/uL (ref 150–400)
RBC: 4.47 MIL/uL (ref 4.22–5.81)
RDW: 12.5 % (ref 11.5–15.5)
WBC: 7.9 10*3/uL (ref 4.0–10.5)

## 2012-06-20 MED ORDER — OXYCODONE-ACETAMINOPHEN 5-325 MG PO TABS
1.0000 | ORAL_TABLET | Freq: Once | ORAL | Status: AC
  Administered 2012-06-20: 1 via ORAL
  Filled 2012-06-20: qty 1

## 2012-06-20 MED ORDER — SODIUM CHLORIDE 0.9 % IV SOLN
Freq: Once | INTRAVENOUS | Status: AC
  Administered 2012-06-20: 18:00:00 via INTRAVENOUS

## 2012-06-20 MED ORDER — IOHEXOL 300 MG/ML  SOLN
20.0000 mL | INTRAMUSCULAR | Status: AC
  Administered 2012-06-20 (×2): 20 mL via ORAL

## 2012-06-20 MED ORDER — PHENAZOPYRIDINE HCL 200 MG PO TABS: 200.0000 mg | ORAL_TABLET | Freq: Three times a day (TID) | ORAL | Status: DC

## 2012-06-20 MED ORDER — MORPHINE SULFATE 4 MG/ML IJ SOLN
4.0000 mg | Freq: Once | INTRAMUSCULAR | Status: AC
  Administered 2012-06-20: 4 mg via INTRAVENOUS
  Filled 2012-06-20: qty 1

## 2012-06-20 MED ORDER — HYDROCODONE-ACETAMINOPHEN 5-325 MG PO TABS: 1.0000 | ORAL_TABLET | ORAL | Status: DC | PRN

## 2012-06-20 MED ORDER — OXYCODONE-ACETAMINOPHEN 5-325 MG PO TABS: 1.0000 | ORAL_TABLET | ORAL | Status: DC | PRN

## 2012-06-20 MED ORDER — IOHEXOL 300 MG/ML  SOLN
100.0000 mL | Freq: Once | INTRAMUSCULAR | Status: AC | PRN
  Administered 2012-06-20: 100 mL via INTRAVENOUS

## 2012-06-20 MED ORDER — ONDANSETRON HCL 8 MG PO TABS
4.0000 mg | ORAL_TABLET | Freq: Once | ORAL | Status: AC
  Administered 2012-06-20: 4 mg via ORAL
  Filled 2012-06-20: qty 1

## 2012-06-20 NOTE — ED Notes (Signed)
Pt up walking out of room.  States that he wants to go and smoke now and is leaving. Pt informed that smoking is not allowed on this campus and he would have to sign out ama.  Pt states will stay at this time but needs to smoke now.

## 2012-06-20 NOTE — ED Notes (Signed)
Pt back from ct.  Pt was wondering around in hallway in front of ct and was directed back to the ed by another nurse. Pt vss.

## 2012-06-20 NOTE — ED Provider Notes (Signed)
Medical screening examination/treatment/procedure(s) were performed by non-physician practitioner and as supervising physician I was immediately available for consultation/collaboration.   Glynn Octave, MD 06/20/12 [phone]

## 2012-06-20 NOTE — ED Provider Notes (Signed)
8:55 PM Patient is in CDU holding for labs, UA, CT abd/pelvis.  Sign out received from [employer], PA-C.  Pt with dysuria, hematuria, RLQ pain.  Pain is worse with movement.  Workup is negative for acute process.  I have discussed all results with patient and significant other.  Pt reports continued pain, states percocet worked better than morphine.  Will redose.  Pain is located in his RLQ abdomen.  Denies testicular pain or swelling.  States this feels like prior UTI.  I will send urine for culture. Plan is for d/c home with urology follow up - patient and significant other state patient has had chronic problems with starting his urinary flow and pain with urination.  Will d/c home with pain medication and pyridium.    Discussed all results with patient.  Pt given return precautions.  Pt verbalizes understanding and agrees with plan.     Results for orders placed during the hospital encounter of 06/20/12  URINALYSIS, ROUTINE W REFLEX MICROSCOPIC      Component Value Range   Color, Urine YELLOW  YELLOW   APPearance CLEAR  CLEAR   Specific Gravity, Urine 1.014  1.005 - 1.030   pH 5.5  5.0 - 8.0   Glucose, UA NEGATIVE  NEGATIVE mg/dL   Hgb urine dipstick NEGATIVE  NEGATIVE   Bilirubin Urine NEGATIVE  NEGATIVE   Ketones, ur NEGATIVE  NEGATIVE mg/dL   Protein, ur NEGATIVE  NEGATIVE mg/dL   Urobilinogen, UA 0.2  0.0 - 1.0 mg/dL   Nitrite NEGATIVE  NEGATIVE   Leukocytes, UA NEGATIVE  NEGATIVE  CBC WITH DIFFERENTIAL      Component Value Range   WBC 7.9  4.0 - 10.5 K/uL   RBC 4.47  4.22 - 5.81 MIL/uL   Hemoglobin 14.8  13.0 - 17.0 g/dL   HCT 16.1  09.6 - 04.5 %   MCV 97.3  78.0 - 100.0 fL   MCH 33.1  26.0 - 34.0 pg   MCHC 34.0  30.0 - 36.0 g/dL   RDW 40.9  81.1 - 91.4 %   Platelets 236  150 - 400 K/uL   Neutrophils Relative 37 (*) 43 - 77 %   Neutro Abs 2.9  1.7 - 7.7 K/uL   Lymphocytes Relative 55 (*) 12 - 46 %   Lymphs Abs 4.3 (*) 0.7 - 4.0 K/uL   Monocytes Relative 6  3 - 12 %     Monocytes Absolute 0.5  0.1 - 1.0 K/uL   Eosinophils Relative 3  0 - 5 %   Eosinophils Absolute 0.2  0.0 - 0.7 K/uL   Basophils Relative 0  0 - 1 %   Basophils Absolute 0.0  0.0 - 0.1 K/uL  COMPREHENSIVE METABOLIC PANEL      Component Value Range   Sodium 139  135 - 145 mEq/L   Potassium 4.1  3.5 - 5.1 mEq/L   Chloride 104  96 - 112 mEq/L   CO2 27  19 - 32 mEq/L   Glucose, Bld 80  70 - 99 mg/dL   BUN 12  6 - 23 mg/dL   Creatinine, Ser 7.82  0.50 - 1.35 mg/dL   Calcium 9.5  8.4 - 95.6 mg/dL   Total Protein 6.8  6.0 - 8.3 g/dL   Albumin 4.0  3.5 - 5.2 g/dL   AST 18  0 - 37 U/L   ALT 21  0 - 53 U/L   Alkaline Phosphatase 56  39 - 117  U/L   Total Bilirubin 0.1 (*) 0.3 - 1.2 mg/dL   GFR calc non Af Amer >90  >90 mL/min   GFR calc Af Amer >90  >90 mL/min   Ct Abdomen Pelvis W Contrast  06/20/2012  *RADIOLOGY REPORT*  Clinical Data: Abdominal pain.  Rule out appendicitis.  CT ABDOMEN AND PELVIS WITH CONTRAST  Technique:  Multidetector CT imaging of the abdomen and pelvis was performed following the standard protocol during bolus administration of intravenous contrast.  Contrast: OMNIPAQUE IOHEXOL 300 MG/ML  SOLN 100 ml of Omnipaque-300.  Comparison: CT of the abdomen and pelvis 09/10/2011.  Findings:  Lung Bases: Unremarkable.  Abdomen/Pelvis:  The appendix is well visualized and is normal in appearance.  The appearance of the liver, gallbladder, pancreas, spleen, bilateral adrenal glands and bilateral kidneys is unremarkable.  No ascites or pneumoperitoneum and no pathologic distension of small bowel.  No definite pathologic lymphadenopathy. Prostate and urinary bladder are unremarkable in appearance.  Musculoskeletal: There are no aggressive appearing lytic or blastic lesions noted in the visualized portions of the skeleton.  IMPRESSION: 1.  No acute findings in the abdomen or pelvis to account for the patient's symptoms. 2.  Specifically, the appendix is normal.   Original Report  Authenticated By: Trudie Reed, M.D.       [place], [place] 06/20/12 2109

## 2012-06-20 NOTE — ED Provider Notes (Signed)
History     CSN: [id number]  Arrival date & time 06/20/12  1421   First MD Initiated Contact with Patient 06/20/12 1741      Chief Complaint  Patient presents with  . Flank Pain  . Urinary Tract Infection    (Consider location/radiation/quality/duration/timing/severity/associated sxs/prior treatment) HPI Comments: Austin Bryant is a 26 y.o. male who presents to ER with complaint of abdominal pain. Pt states he woke up this morning with mild pain. States pain was worsened with urination, and states he thinks he saw blood in his urine. Pt states pain was radiating into right testes. States since then pain has become more severe and constant. States pain worsened with movement and palpation of the abdomen. State also has had nausea, vomiting. Last BM yesterday and normal. Denies prior abdominal problems. No hx of kindey stones. Denies current testicular pain. Took ibuprofen prior to coming in with no improvement.   The history is provided by the patient.    Past Medical History  Diagnosis Date  . Headache   . Anxiety   . ADHD (attention deficit hyperactivity disorder)   . Hx MRSA infection 2008    of leg    Past Surgical History  Procedure Date  . No past surgeries   . Eye surgery   . Lacrimal tube insertion 10/19/2011    Procedure: LACRIMAL TUBE INSERTION;  Surgeon: Susa Simmonds, MD;  Location: [facility];  Service: Ophthalmology;  Laterality: Right;  Repair of Laceration of Lacrimal System Right Lower Lid; monostent placed into right lacrimal duct by Dr. Lita Mains, 10/19/2011 @ 1009; lot# [id number]    Family History  Problem Relation Age of Onset  . Anesthesia problems Neg Hx   . Hypotension Neg Hx   . Malignant hyperthermia Neg Hx   . Pseudochol deficiency Neg Hx     History  Substance Use Topics  . Smoking status: Current Every Day Smoker -- 0.5 packs/day for 10 years    Types: Cigarettes  . Smokeless tobacco: Not on file  . Alcohol Use: No      Review of Systems    Constitutional: Negative for fever and chills.  HENT: Negative.   Respiratory: Negative.   Cardiovascular: Negative.   Gastrointestinal: Positive for nausea, vomiting and abdominal pain. Negative for diarrhea and constipation.  Genitourinary: Positive for hematuria, flank pain and testicular pain. Negative for penile swelling and scrotal swelling.  Musculoskeletal: Positive for back pain.  Skin: Negative.   Neurological: Negative for weakness, light-headedness and headaches.  Hematological: Negative.     Allergies  Bee venom and Penicillins  Home Medications   Current Outpatient Rx  Name  Route  Sig  Dispense  Refill  . IBUPROFEN 800 MG PO TABS   Oral   Take 800 mg by mouth once.           BP 142/70  Pulse 97  Temp 98.8 F (37.1 C) (Oral)  Resp 18  SpO2 97%  Physical Exam  Nursing note and vitals reviewed. Constitutional: He appears well-developed and well-nourished.       Appears to be in pain  HENT:  Head: Normocephalic.  Eyes: Conjunctivae normal are normal.  Neck: Neck supple.  Cardiovascular: Normal rate, regular rhythm and normal heart sounds.   Pulmonary/Chest: Effort normal and breath sounds normal. No respiratory distress. He has no wheezes. He has no rales.  Abdominal: Soft. Bowel sounds are normal. He exhibits no distension. There is tenderness. There is rebound and guarding.  RLQ tenderness, guarding  Genitourinary:       Normal scrotum, non tender  Musculoskeletal: He exhibits no edema.  Neurological: He is alert.  Skin: Skin is warm and dry.  Psychiatric: He has a normal mood and affect.    ED Course  Procedures (including critical care time)  Pt with right lower quadrant abdominal pain, hematuria. Will get labs UA. Pain meds ordered.   Results for orders placed during the hospital encounter of 06/20/12  URINALYSIS, ROUTINE W REFLEX MICROSCOPIC      Component Value Range   Color, Urine YELLOW  YELLOW   APPearance CLEAR  CLEAR    Specific Gravity, Urine 1.014  1.005 - 1.030   pH 5.5  5.0 - 8.0   Glucose, UA NEGATIVE  NEGATIVE mg/dL   Hgb urine dipstick NEGATIVE  NEGATIVE   Bilirubin Urine NEGATIVE  NEGATIVE   Ketones, ur NEGATIVE  NEGATIVE mg/dL   Protein, ur NEGATIVE  NEGATIVE mg/dL   Urobilinogen, UA 0.2  0.0 - 1.0 mg/dL   Nitrite NEGATIVE  NEGATIVE   Leukocytes, UA NEGATIVE  NEGATIVE  CBC WITH DIFFERENTIAL      Component Value Range   WBC 7.9  4.0 - 10.5 K/uL   RBC 4.47  4.22 - 5.81 MIL/uL   Hemoglobin 14.8  13.0 - 17.0 g/dL   HCT 16.1  09.6 - 04.5 %   MCV 97.3  78.0 - 100.0 fL   MCH 33.1  26.0 - 34.0 pg   MCHC 34.0  30.0 - 36.0 g/dL   RDW 40.9  81.1 - 91.4 %   Platelets 236  150 - 400 K/uL   Neutrophils Relative 37 (*) 43 - 77 %   Neutro Abs 2.9  1.7 - 7.7 K/uL   Lymphocytes Relative 55 (*) 12 - 46 %   Lymphs Abs 4.3 (*) 0.7 - 4.0 K/uL   Monocytes Relative 6  3 - 12 %   Monocytes Absolute 0.5  0.1 - 1.0 K/uL   Eosinophils Relative 3  0 - 5 %   Eosinophils Absolute 0.2  0.0 - 0.7 K/uL   Basophils Relative 0  0 - 1 %   Basophils Absolute 0.0  0.0 - 0.1 K/uL  COMPREHENSIVE METABOLIC PANEL      Component Value Range   Sodium 139  135 - 145 mEq/L   Potassium 4.1  3.5 - 5.1 mEq/L   Chloride 104  96 - 112 mEq/L   CO2 27  19 - 32 mEq/L   Glucose, Bld 80  70 - 99 mg/dL   BUN 12  6 - 23 mg/dL   Creatinine, Ser 7.82  0.50 - 1.35 mg/dL   Calcium 9.5  8.4 - 95.6 mg/dL   Total Protein 6.8  6.0 - 8.3 g/dL   Albumin 4.0  3.5 - 5.2 g/dL   AST 18  0 - 37 U/L   ALT 21  0 - 53 U/L   Alkaline Phosphatase 56  39 - 117 U/L   Total Bilirubin 0.1 (*) 0.3 - 1.2 mg/dL   GFR calc non Af Amer >90  >90 mL/min   GFR calc Af Amer >90  >90 mL/min    UA negative. Labs unremarkable. Will get CT to r/o acute appy vs hydronephrosis.   Pt moved to CDU pending CT, to be dispositioned based on results by PA [place].       No diagnosis found.    MDM  Lottie Mussel, PA 06/21/12 [phone]

## 2012-06-20 NOTE — ED Notes (Signed)
Started ~ 0800: Hematuria and pain, n/v, cramping, rt. Sided lower abd. Pain and rt. Lower back pain. Hx. Of uti.

## 2012-06-21 NOTE — ED Provider Notes (Signed)
Medical screening examination/treatment/procedure(s) were performed by non-physician practitioner and as supervising physician I was immediately available for consultation/collaboration.  Malynn Lucy, MD 06/21/12 1147 

## 2012-06-22 LAB — URINE CULTURE: Colony Count: NO GROWTH

## 2012-06-23 ENCOUNTER — Emergency Department (HOSPITAL_COMMUNITY): Admission: EM | Admit: 2012-06-23 | Discharge: 2012-06-23 | Discharge status: Against Medical Advice | Payer: Self-pay

## 2012-06-23 ENCOUNTER — Encounter (HOSPITAL_COMMUNITY): Payer: Self-pay | Admitting: Emergency Medicine

## 2012-06-23 ENCOUNTER — Emergency Department (HOSPITAL_COMMUNITY)
Admission: EM | Admit: 2012-06-23 | Discharge: 2012-06-24 | Disposition: A | Discharge status: Home / Self Care | Payer: Self-pay | Attending: Emergency Medicine | Admitting: Emergency Medicine

## 2012-06-23 DIAGNOSIS — Z8614 Personal history of Methicillin resistant Staphylococcus aureus infection: Secondary | ICD-10-CM | POA: Insufficient documentation

## 2012-06-23 DIAGNOSIS — Z8659 Personal history of other mental and behavioral disorders: Secondary | ICD-10-CM | POA: Insufficient documentation

## 2012-06-23 DIAGNOSIS — R319 Hematuria, unspecified: Secondary | ICD-10-CM | POA: Insufficient documentation

## 2012-06-23 DIAGNOSIS — F909 Attention-deficit hyperactivity disorder, unspecified type: Secondary | ICD-10-CM | POA: Insufficient documentation

## 2012-06-23 DIAGNOSIS — R109 Unspecified abdominal pain: Secondary | ICD-10-CM

## 2012-06-23 DIAGNOSIS — F17200 Nicotine dependence, unspecified, uncomplicated: Secondary | ICD-10-CM | POA: Insufficient documentation

## 2012-06-23 LAB — URINALYSIS, ROUTINE W REFLEX MICROSCOPIC
Glucose, UA: NEGATIVE mg/dL
Hgb urine dipstick: NEGATIVE
Ketones, ur: NEGATIVE mg/dL
Protein, ur: NEGATIVE mg/dL

## 2012-06-23 LAB — CBC
Hemoglobin: 15.5 g/dL (ref 13.0–17.0)
MCH: 33.9 pg (ref 26.0–34.0)
MCV: 95.6 fL (ref 78.0–100.0)
RBC: 4.57 MIL/uL (ref 4.22–5.81)

## 2012-06-23 MED ORDER — OXYCODONE-ACETAMINOPHEN 5-325 MG PO TABS
1.0000 | ORAL_TABLET | Freq: Once | ORAL | Status: AC
  Administered 2012-06-23: 1 via ORAL
  Filled 2012-06-23: qty 1

## 2012-06-23 NOTE — ED Notes (Addendum)
Pt sts he has blood in his urine and he has pain in his lower back and lower abd. Pt was seen here Friday for same and was referred to a specialist. Pt does not have an appt until Thursday.

## 2012-06-23 NOTE — ED Notes (Signed)
Pt been up to the desk multiple times asking for pain medicine. Informed pt about policies and procedures. Informed that pt unable to receive pain medicine until seen by a EDP.

## 2012-06-23 NOTE — ED Provider Notes (Addendum)
History     CSN: [id number]  Arrival date & time 06/23/12  1845   First MD Initiated Contact with Patient 06/23/12 2319      Chief Complaint  Patient presents with  . Hematuria    (Consider location/radiation/quality/duration/timing/severity/associated sxs/prior treatment) Patient is a 26 y.o. male presenting with abdominal pain.  Abdominal Pain The primary symptoms of the illness include abdominal pain. The current episode started more than 2 days ago. The onset of the illness was gradual. The problem has not changed since onset. Associated with: hematuria. Additional symptoms associated with the illness include hematuria.    Past Medical History  Diagnosis Date  . Headache   . Anxiety   . ADHD (attention deficit hyperactivity disorder)   . Hx MRSA infection 2008    of leg    Past Surgical History  Procedure Date  . No past surgeries   . Eye surgery   . Lacrimal tube insertion 10/19/2011    Procedure: LACRIMAL TUBE INSERTION;  Surgeon: Susa Simmonds, MD;  Location: [facility];  Service: Ophthalmology;  Laterality: Right;  Repair of Laceration of Lacrimal System Right Lower Lid; monostent placed into right lacrimal duct by Dr. Lita Mains, 10/19/2011 @ 1009; lot# [id number]    Family History  Problem Relation Age of Onset  . Anesthesia problems Neg Hx   . Hypotension Neg Hx   . Malignant hyperthermia Neg Hx   . Pseudochol deficiency Neg Hx     History  Substance Use Topics  . Smoking status: Current Every Day Smoker -- 0.5 packs/day for 10 years    Types: Cigarettes  . Smokeless tobacco: Never Used  . Alcohol Use: No      Review of Systems  Gastrointestinal: Positive for abdominal pain.  Genitourinary: Positive for hematuria.  All other systems reviewed and are negative.    Allergies  Bee venom and Penicillins  Home Medications   Current Outpatient Rx  Name  Route  Sig  Dispense  Refill  . EPINEPHRINE 0.3 MG/0.3ML IJ DEVI   Intramuscular   Inject 0.3 mg  into the muscle daily as needed. For severe allergic reaction         . IBUPROFEN 800 MG PO TABS   Oral   Take 800 mg by mouth once.         . OXYCODONE-ACETAMINOPHEN 5-325 MG PO TABS   Oral   Take 1-2 tablets by mouth every 4 (four) hours as needed for pain.   15 tablet   0   . PHENAZOPYRIDINE HCL 100 MG PO TABS   Oral   Take 100 mg by mouth 2 (two) times daily.           BP 177/64  Pulse 110  Temp 98.9 F (37.2 C) (Oral)  Resp 22  SpO2 100%  Physical Exam  Constitutional: He is oriented to person, place, and time. He appears well-developed and well-nourished.  HENT:  Head: Normocephalic and atraumatic.  Eyes: Conjunctivae normal are normal. Pupils are equal, round, and reactive to light.  Neck: Normal range of motion. Neck supple.  Cardiovascular: Normal rate, regular rhythm, normal heart sounds and intact distal pulses.   Pulmonary/Chest: Effort normal and breath sounds normal.  Abdominal: Soft. Bowel sounds are normal. There is tenderness.  Musculoskeletal: Tenderness: suprapubic.  Neurological: He is alert and oriented to person, place, and time.  Skin: Skin is warm and dry.  Psychiatric: He has a normal mood and affect. His behavior is normal. Judgment and thought  content normal.    ED Course  Procedures (including critical care time)   Labs Reviewed  CBC  BASIC METABOLIC PANEL  URINALYSIS, ROUTINE W REFLEX MICROSCOPIC   No results found.   No diagnosis found.    MDM  + abd pain, not improved from previous.  Previous w/o includes neg ct and unremarkable blood work.  Will labs,  Analgesia,  reassess   Labs benign.  Will dc to oupt fu.  analgesia     Austin Morgan Lytle Michaels, MD 06/24/12 0037  [name], MD 06/24/12 [phone]

## 2012-06-23 NOTE — ED Notes (Signed)
Did not answer when called x2 

## 2012-06-24 LAB — BASIC METABOLIC PANEL
CO2: 26 mEq/L (ref 19–32)
Calcium: 9.6 mg/dL (ref 8.4–10.5)
Creatinine, Ser: 1.06 mg/dL (ref 0.50–1.35)
Glucose, Bld: 95 mg/dL (ref 70–99)

## 2012-06-24 MED ORDER — AZITHROMYCIN 1 G PO PACK
2.0000 g | PACK | Freq: Once | ORAL | Status: AC
Start: 2012-06-24 — End: 2012-06-24
  Administered 2012-06-24: 2 g via ORAL
  Filled 2012-06-24: qty 2

## 2012-06-24 MED ORDER — OXYCODONE-ACETAMINOPHEN 5-325 MG PO TABS: 2.0000 | ORAL_TABLET | ORAL | Status: DC | PRN

## 2012-06-24 MED ORDER — METRONIDAZOLE 500 MG PO TABS
2000.0000 mg | ORAL_TABLET | Freq: Once | ORAL | Status: AC
  Administered 2012-06-24: 2000 mg via ORAL
  Filled 2012-06-24: qty 4

## 2012-07-18 ENCOUNTER — Encounter (HOSPITAL_COMMUNITY): Payer: Self-pay | Admitting: Adult Health

## 2012-07-18 ENCOUNTER — Emergency Department (HOSPITAL_COMMUNITY): Payer: Self-pay

## 2012-07-18 ENCOUNTER — Encounter (HOSPITAL_COMMUNITY): Payer: Self-pay | Admitting: Emergency Medicine

## 2012-07-18 ENCOUNTER — Emergency Department (HOSPITAL_COMMUNITY)
Admission: EM | Admit: 2012-07-18 | Discharge: 2012-07-18 | Disposition: A | Discharge status: Home / Self Care | Payer: Self-pay | Attending: Emergency Medicine | Admitting: Emergency Medicine

## 2012-07-18 ENCOUNTER — Emergency Department (HOSPITAL_COMMUNITY)
Admission: EM | Admit: 2012-07-18 | Discharge: 2012-07-18 | Discharge status: Against Medical Advice | Payer: Self-pay | Attending: Emergency Medicine | Admitting: Emergency Medicine

## 2012-07-18 DIAGNOSIS — S0100XA Unspecified open wound of scalp, initial encounter: Secondary | ICD-10-CM | POA: Insufficient documentation

## 2012-07-18 DIAGNOSIS — S199XXA Unspecified injury of neck, initial encounter: Secondary | ICD-10-CM | POA: Insufficient documentation

## 2012-07-18 DIAGNOSIS — IMO0002 Reserved for concepts with insufficient information to code with codable children: Secondary | ICD-10-CM | POA: Insufficient documentation

## 2012-07-18 DIAGNOSIS — Y939 Activity, unspecified: Secondary | ICD-10-CM | POA: Insufficient documentation

## 2012-07-18 DIAGNOSIS — Y929 Unspecified place or not applicable: Secondary | ICD-10-CM | POA: Insufficient documentation

## 2012-07-18 DIAGNOSIS — Z8614 Personal history of Methicillin resistant Staphylococcus aureus infection: Secondary | ICD-10-CM | POA: Insufficient documentation

## 2012-07-18 DIAGNOSIS — S0190XA Unspecified open wound of unspecified part of head, initial encounter: Secondary | ICD-10-CM | POA: Insufficient documentation

## 2012-07-18 DIAGNOSIS — F17200 Nicotine dependence, unspecified, uncomplicated: Secondary | ICD-10-CM | POA: Insufficient documentation

## 2012-07-18 DIAGNOSIS — S20219A Contusion of unspecified front wall of thorax, initial encounter: Secondary | ICD-10-CM | POA: Insufficient documentation

## 2012-07-18 DIAGNOSIS — S0101XA Laceration without foreign body of scalp, initial encounter: Secondary | ICD-10-CM

## 2012-07-18 DIAGNOSIS — S060X9A Concussion with loss of consciousness of unspecified duration, initial encounter: Secondary | ICD-10-CM | POA: Insufficient documentation

## 2012-07-18 DIAGNOSIS — Z9889 Other specified postprocedural states: Secondary | ICD-10-CM | POA: Insufficient documentation

## 2012-07-18 DIAGNOSIS — S0993XA Unspecified injury of face, initial encounter: Secondary | ICD-10-CM | POA: Insufficient documentation

## 2012-07-18 DIAGNOSIS — R51 Headache: Secondary | ICD-10-CM | POA: Insufficient documentation

## 2012-07-18 DIAGNOSIS — Z8659 Personal history of other mental and behavioral disorders: Secondary | ICD-10-CM | POA: Insufficient documentation

## 2012-07-18 DIAGNOSIS — Z8679 Personal history of other diseases of the circulatory system: Secondary | ICD-10-CM | POA: Insufficient documentation

## 2012-07-18 DIAGNOSIS — T07XXXA Unspecified multiple injuries, initial encounter: Secondary | ICD-10-CM | POA: Insufficient documentation

## 2012-07-18 LAB — CBC WITH DIFFERENTIAL/PLATELET
Basophils Absolute: 0 10*3/uL (ref 0.0–0.1)
Basophils Relative: 0 % (ref 0–1)
Eosinophils Relative: 1 % (ref 0–5)
HCT: 43.1 % (ref 39.0–52.0)
Lymphocytes Relative: 40 % (ref 12–46)
MCHC: 35 g/dL (ref 30.0–36.0)
MCV: 95.6 fL (ref 78.0–100.0)
Monocytes Absolute: 0.6 10*3/uL (ref 0.1–1.0)
RDW: 11.9 % (ref 11.5–15.5)

## 2012-07-18 LAB — BASIC METABOLIC PANEL
BUN: 16 mg/dL (ref 6–23)
CO2: 26 mEq/L (ref 19–32)
Calcium: 9.6 mg/dL (ref 8.4–10.5)
Creatinine, Ser: 1.06 mg/dL (ref 0.50–1.35)

## 2012-07-18 MED ORDER — IBUPROFEN 800 MG PO TABS
800.0000 mg | ORAL_TABLET | Freq: Once | ORAL | Status: AC
  Administered 2012-07-18: 800 mg via ORAL
  Filled 2012-07-18: qty 1

## 2012-07-18 MED ORDER — LIDOCAINE HCL (PF) 1 % IJ SOLN
INTRAMUSCULAR | Status: AC
  Administered 2012-07-18: 07:00:00
  Filled 2012-07-18: qty 5

## 2012-07-18 MED ORDER — HYDROMORPHONE HCL PF 1 MG/ML IJ SOLN
1.0000 mg | Freq: Once | INTRAMUSCULAR | Status: AC
  Administered 2012-07-18: 1 mg via INTRAMUSCULAR
  Filled 2012-07-18: qty 1

## 2012-07-18 MED ORDER — SODIUM CHLORIDE 0.9 % IV SOLN
Freq: Once | INTRAVENOUS | Status: AC
  Administered 2012-07-18: 1000 mL via INTRAVENOUS

## 2012-07-18 MED ORDER — HYDROCODONE-IBUPROFEN 7.5-200 MG PO TABS: 1.0000 | ORAL_TABLET | Freq: Four times a day (QID) | ORAL | Status: DC | PRN

## 2012-07-18 MED ORDER — HYDROMORPHONE HCL PF 2 MG/ML IJ SOLN
INTRAMUSCULAR | Status: AC
  Administered 2012-07-18: 2 mg via INTRAMUSCULAR
  Filled 2012-07-18: qty 1

## 2012-07-18 MED ORDER — MORPHINE SULFATE 4 MG/ML IJ SOLN
4.0000 mg | Freq: Once | INTRAMUSCULAR | Status: AC
  Administered 2012-07-18: 4 mg via INTRAVENOUS
  Filled 2012-07-18: qty 1

## 2012-07-18 MED ORDER — IOHEXOL 300 MG/ML  SOLN
100.0000 mL | Freq: Once | INTRAMUSCULAR | Status: AC | PRN
  Administered 2012-07-18: 100 mL via INTRAVENOUS

## 2012-07-18 MED ORDER — LIDOCAINE-EPINEPHRINE-TETRACAINE (LET) SOLUTION
3.0000 mL | Freq: Once | NASAL | Status: AC
  Administered 2012-07-18: 3 mL via TOPICAL
  Filled 2012-07-18: qty 3

## 2012-07-18 MED ORDER — ONDANSETRON HCL 4 MG/2ML IJ SOLN
4.0000 mg | Freq: Once | INTRAMUSCULAR | Status: AC
  Administered 2012-07-18: 4 mg via INTRAVENOUS
  Filled 2012-07-18: qty 2

## 2012-07-18 MED ORDER — HYDROMORPHONE HCL PF 1 MG/ML IJ SOLN: 2.0000 mg | Freq: Once | INTRAMUSCULAR | Status: DC

## 2012-07-18 NOTE — ED Notes (Signed)
Patient back from CT scan.

## 2012-07-18 NOTE — ED Notes (Signed)
Respiratory notified to do teaching and dispense incentive spirometer.

## 2012-07-18 NOTE — ED Notes (Signed)
Family at bedside. 

## 2012-07-18 NOTE — ED Notes (Signed)
RN at bedside
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
## 2012-07-18 NOTE — ED Notes (Signed)
Patient ambulated to the bathroom without assistance.
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
## 2012-07-18 NOTE — ED Notes (Signed)
Patient and family are both asleep at this time.

## 2012-07-18 NOTE — ED Notes (Signed)
Patient is resting comfortably. 

## 2012-07-18 NOTE — ED Notes (Signed)
Patient reports initially was treated at [facility] ED but left due to an issue related to his IV bleeding - not sure what he means about this.  States he did receive xrays and CT scan of head.
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
## 2012-07-18 NOTE — ED Notes (Signed)
Patient is in pain. RN made aware.
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
## 2012-07-18 NOTE — ED Notes (Signed)
RN to room to see patient. Pt not in room at this time. Informed by triage RN patient walked out AMA in an angry manner.

## 2012-07-18 NOTE — ED Notes (Signed)
Patient and significant other having loud verbal exchange in room, she left and patient followed her out of the room to lobby area, security notified.

## 2012-07-18 NOTE — ED Notes (Signed)
Patient was talked into coming back to the room and waiting on Respiratory and discharge paperwork.

## 2012-07-18 NOTE — ED Notes (Signed)
Patient transported to CT 

## 2012-07-22 ENCOUNTER — Encounter (HOSPITAL_COMMUNITY): Payer: Self-pay

## 2012-07-22 ENCOUNTER — Emergency Department (HOSPITAL_COMMUNITY): Payer: Self-pay

## 2012-07-22 ENCOUNTER — Emergency Department (HOSPITAL_COMMUNITY)
Admission: EM | Admit: 2012-07-22 | Discharge: 2012-07-22 | Disposition: A | Discharge status: Home / Self Care | Payer: Self-pay | Attending: Emergency Medicine | Admitting: Emergency Medicine

## 2012-07-22 DIAGNOSIS — S0100XA Unspecified open wound of scalp, initial encounter: Secondary | ICD-10-CM | POA: Insufficient documentation

## 2012-07-22 DIAGNOSIS — T07XXXA Unspecified multiple injuries, initial encounter: Secondary | ICD-10-CM | POA: Insufficient documentation

## 2012-07-22 DIAGNOSIS — S02109A Fracture of base of skull, unspecified side, initial encounter for closed fracture: Secondary | ICD-10-CM | POA: Insufficient documentation

## 2012-07-22 DIAGNOSIS — S01501A Unspecified open wound of lip, initial encounter: Secondary | ICD-10-CM | POA: Insufficient documentation

## 2012-07-22 DIAGNOSIS — S022XXA Fracture of nasal bones, initial encounter for closed fracture: Secondary | ICD-10-CM | POA: Insufficient documentation

## 2012-07-22 DIAGNOSIS — F17200 Nicotine dependence, unspecified, uncomplicated: Secondary | ICD-10-CM | POA: Insufficient documentation

## 2012-07-22 DIAGNOSIS — S0101XA Laceration without foreign body of scalp, initial encounter: Secondary | ICD-10-CM

## 2012-07-22 DIAGNOSIS — S0219XA Other fracture of base of skull, initial encounter for closed fracture: Secondary | ICD-10-CM

## 2012-07-22 DIAGNOSIS — Z8614 Personal history of Methicillin resistant Staphylococcus aureus infection: Secondary | ICD-10-CM | POA: Insufficient documentation

## 2012-07-22 DIAGNOSIS — S0181XA Laceration without foreign body of other part of head, initial encounter: Secondary | ICD-10-CM

## 2012-07-22 DIAGNOSIS — S41109A Unspecified open wound of unspecified upper arm, initial encounter: Secondary | ICD-10-CM | POA: Insufficient documentation

## 2012-07-22 DIAGNOSIS — S0180XA Unspecified open wound of other part of head, initial encounter: Secondary | ICD-10-CM | POA: Insufficient documentation

## 2012-07-22 DIAGNOSIS — Z8659 Personal history of other mental and behavioral disorders: Secondary | ICD-10-CM | POA: Insufficient documentation

## 2012-07-22 DIAGNOSIS — S01511A Laceration without foreign body of lip, initial encounter: Secondary | ICD-10-CM

## 2012-07-22 MED ORDER — HYDROMORPHONE HCL PF 1 MG/ML IJ SOLN
1.0000 mg | INTRAMUSCULAR | Status: AC
  Administered 2012-07-22: 1 mg via INTRAMUSCULAR
  Filled 2012-07-22: qty 1

## 2012-07-22 MED ORDER — OXYCODONE-ACETAMINOPHEN 5-325 MG PO TABS: 1.0000 | ORAL_TABLET | Freq: Four times a day (QID) | ORAL | Status: DC | PRN

## 2012-07-22 MED ORDER — CLINDAMYCIN HCL 150 MG PO CAPS: 300.0000 mg | ORAL_CAPSULE | Freq: Three times a day (TID) | ORAL | Status: DC

## 2012-07-22 MED ORDER — OXYCODONE-ACETAMINOPHEN 5-325 MG PO TABS
2.0000 | ORAL_TABLET | Freq: Once | ORAL | Status: AC
  Administered 2012-07-22: 2 via ORAL
  Filled 2012-07-22: qty 2

## 2012-07-22 MED ORDER — HYDROCODONE-ACETAMINOPHEN 5-325 MG PO TABS
2.0000 | ORAL_TABLET | Freq: Once | ORAL | Status: DC
  Filled 2012-07-22: qty 2

## 2012-07-22 MED ORDER — HYDROMORPHONE HCL PF 1 MG/ML IJ SOLN
1.0000 mg | Freq: Once | INTRAMUSCULAR | Status: AC
  Administered 2012-07-22: 1 mg via INTRAVENOUS
  Filled 2012-07-22: qty 1

## 2012-07-22 NOTE — ED Notes (Signed)
md at bedside for suturing

## 2012-07-22 NOTE — ED Notes (Signed)
Pt states can not take vicodin.  md aware and new orders obtained.
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
## 2012-07-22 NOTE — ED Notes (Signed)
In pt room to give pain meds.  Pt states "I guess we will have to go to [place] to get the dilaudid I had last time".  Pt and family speaking loudly about not getting the pain meds he wants here.
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
## 2012-07-22 NOTE — ED Notes (Signed)
Pt dc to home.  Pt states understanding to dc instructions.   Pt ambulatory to exit without difficulty.  Pt denied need for w/c

## 2012-07-22 NOTE — ED Notes (Signed)
PD in at bedside again to speak with pt and family

## 2012-07-22 NOTE — ED Notes (Signed)
Pt given a wash cloth to clean face and chest.  Pt in bathroom.

## 2012-07-23 ENCOUNTER — Encounter (HOSPITAL_COMMUNITY): Payer: Self-pay | Admitting: Emergency Medicine

## 2012-07-23 ENCOUNTER — Emergency Department (HOSPITAL_COMMUNITY)
Admission: EM | Admit: 2012-07-23 | Discharge: 2012-07-23 | Disposition: A | Discharge status: Home / Self Care | Payer: Self-pay | Attending: Emergency Medicine | Admitting: Emergency Medicine

## 2012-07-23 DIAGNOSIS — F17200 Nicotine dependence, unspecified, uncomplicated: Secondary | ICD-10-CM | POA: Insufficient documentation

## 2012-07-23 DIAGNOSIS — T148XXA Other injury of unspecified body region, initial encounter: Secondary | ICD-10-CM | POA: Insufficient documentation

## 2012-07-23 DIAGNOSIS — T07XXXA Unspecified multiple injuries, initial encounter: Secondary | ICD-10-CM | POA: Insufficient documentation

## 2012-07-23 DIAGNOSIS — Z79899 Other long term (current) drug therapy: Secondary | ICD-10-CM | POA: Insufficient documentation

## 2012-07-23 DIAGNOSIS — F909 Attention-deficit hyperactivity disorder, unspecified type: Secondary | ICD-10-CM | POA: Insufficient documentation

## 2012-07-23 DIAGNOSIS — Z8659 Personal history of other mental and behavioral disorders: Secondary | ICD-10-CM | POA: Insufficient documentation

## 2012-07-23 DIAGNOSIS — Z8679 Personal history of other diseases of the circulatory system: Secondary | ICD-10-CM | POA: Insufficient documentation

## 2012-07-23 DIAGNOSIS — Z8614 Personal history of Methicillin resistant Staphylococcus aureus infection: Secondary | ICD-10-CM | POA: Insufficient documentation

## 2012-07-23 MED ORDER — TRAMADOL HCL 50 MG PO TABS: 50.0000 mg | ORAL_TABLET | Freq: Four times a day (QID) | ORAL | Status: DC | PRN

## 2012-07-23 MED ORDER — TRAMADOL HCL 50 MG PO TABS
50.0000 mg | ORAL_TABLET | Freq: Once | ORAL | Status: AC
  Administered 2012-07-23: 50 mg via ORAL

## 2012-07-23 MED ORDER — TRAMADOL HCL 50 MG PO TABS
ORAL_TABLET | ORAL | Status: AC
  Administered 2012-07-23: 50 mg via ORAL
  Filled 2012-07-23: qty 1
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
## 2012-07-23 NOTE — ED Notes (Signed)
Patient with no complaints at this time. Respirations even and unlabored. Skin warm/dry. Discharge instructions reviewed with patient at this time. Patient given opportunity to voice concerns/ask questions. Patient discharged at this time and left Emergency Department with steady gait.   

## 2012-07-23 NOTE — ED Notes (Signed)
Pt was called to triage, no answer. 

## 2012-07-24 ENCOUNTER — Telehealth: Payer: Self-pay | Admitting: Orthopedic Surgery
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
## 2012-07-24 NOTE — ED Provider Notes (Signed)
Medical screening examination/treatment/procedure(s) were performed by non-physician practitioner and as supervising physician I was immediately available for consultation/collaboration.   Carleene Cooper III, MD 07/24/12 [phone]
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
## 2012-07-29 ENCOUNTER — Encounter (HOSPITAL_COMMUNITY): Payer: Self-pay | Admitting: *Deleted

## 2012-07-29 ENCOUNTER — Emergency Department (HOSPITAL_COMMUNITY)
Admission: EM | Admit: 2012-07-29 | Discharge: 2012-07-29 | Disposition: A | Discharge status: Home / Self Care | Payer: Self-pay | Attending: Emergency Medicine | Admitting: Emergency Medicine

## 2012-07-29 ENCOUNTER — Emergency Department (HOSPITAL_COMMUNITY): Payer: Self-pay

## 2012-07-29 DIAGNOSIS — F909 Attention-deficit hyperactivity disorder, unspecified type: Secondary | ICD-10-CM | POA: Insufficient documentation

## 2012-07-29 DIAGNOSIS — W108XXA Fall (on) (from) other stairs and steps, initial encounter: Secondary | ICD-10-CM | POA: Insufficient documentation

## 2012-07-29 DIAGNOSIS — R0602 Shortness of breath: Secondary | ICD-10-CM | POA: Insufficient documentation

## 2012-07-29 DIAGNOSIS — F17200 Nicotine dependence, unspecified, uncomplicated: Secondary | ICD-10-CM | POA: Insufficient documentation

## 2012-07-29 DIAGNOSIS — W19XXXA Unspecified fall, initial encounter: Secondary | ICD-10-CM

## 2012-07-29 DIAGNOSIS — R0781 Pleurodynia: Secondary | ICD-10-CM

## 2012-07-29 DIAGNOSIS — Z8614 Personal history of Methicillin resistant Staphylococcus aureus infection: Secondary | ICD-10-CM | POA: Insufficient documentation

## 2012-07-29 DIAGNOSIS — Z8659 Personal history of other mental and behavioral disorders: Secondary | ICD-10-CM | POA: Insufficient documentation

## 2012-07-29 DIAGNOSIS — Y929 Unspecified place or not applicable: Secondary | ICD-10-CM | POA: Insufficient documentation

## 2012-07-29 DIAGNOSIS — S298XXA Other specified injuries of thorax, initial encounter: Secondary | ICD-10-CM | POA: Insufficient documentation

## 2012-07-29 DIAGNOSIS — Y939 Activity, unspecified: Secondary | ICD-10-CM | POA: Insufficient documentation

## 2012-07-29 MED ORDER — TRAMADOL HCL 50 MG PO TABS
50.0000 mg | ORAL_TABLET | Freq: Once | ORAL | Status: AC
  Administered 2012-07-29: 50 mg via ORAL
  Filled 2012-07-29: qty 1

## 2012-07-29 MED ORDER — TRAMADOL HCL 50 MG PO TABS: 50.0000 mg | ORAL_TABLET | Freq: Four times a day (QID) | ORAL | Status: DC | PRN
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
## 2012-07-29 NOTE — ED Provider Notes (Signed)
Medical screening examination/treatment/procedure(s) were conducted as a shared visit with non-physician practitioner(s) and myself.  I personally evaluated the patient during the encounter   The patient's chart reviewed discussed with the mid-level. Patient seen by me. All transiently adequate for the pain needs of the patient. Patient has been very pressured in wanting narcotic pain medication particularly wanted Percocet.  Reviewed patient's narcotic list on statements. Seems appropriate to treat with Ultram discuss that with patient and he agreed he'll go to the Ultram.   Shelda Jakes, MD 07/29/12 [phone]

## 2012-07-29 NOTE — ED Notes (Signed)
Pt states slipped down wet steps and fell onto left ribs.  States that he did not loose consciousness but did feel dizzy.  Pt states it was only 4 steps

## 2012-08-05 ENCOUNTER — Emergency Department (HOSPITAL_COMMUNITY)
Admission: EM | Admit: 2012-08-05 | Discharge: 2012-08-05 | Disposition: A | Discharge status: Home / Self Care | Payer: Self-pay | Attending: Emergency Medicine | Admitting: Emergency Medicine

## 2012-08-05 ENCOUNTER — Encounter (HOSPITAL_COMMUNITY): Payer: Self-pay | Admitting: *Deleted

## 2012-08-05 DIAGNOSIS — R52 Pain, unspecified: Secondary | ICD-10-CM | POA: Insufficient documentation

## 2012-08-05 DIAGNOSIS — F909 Attention-deficit hyperactivity disorder, unspecified type: Secondary | ICD-10-CM | POA: Insufficient documentation

## 2012-08-05 DIAGNOSIS — Z8614 Personal history of Methicillin resistant Staphylococcus aureus infection: Secondary | ICD-10-CM | POA: Insufficient documentation

## 2012-08-05 DIAGNOSIS — Z8659 Personal history of other mental and behavioral disorders: Secondary | ICD-10-CM | POA: Insufficient documentation

## 2012-08-05 DIAGNOSIS — F17200 Nicotine dependence, unspecified, uncomplicated: Secondary | ICD-10-CM | POA: Insufficient documentation

## 2012-08-05 DIAGNOSIS — Z4802 Encounter for removal of sutures: Secondary | ICD-10-CM | POA: Insufficient documentation

## 2012-08-05 MED ORDER — TRAMADOL HCL 50 MG PO TABS: 50.0000 mg | ORAL_TABLET | Freq: Four times a day (QID) | ORAL | Status: DC | PRN

## 2012-08-05 NOTE — ED Provider Notes (Signed)
Medical screening examination/treatment/procedure(s) were performed by non-physician practitioner and as supervising physician I was immediately available for consultation/collaboration.  Christianne Zacher, MD 08/05/12 1738 
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
## 2012-08-05 NOTE — ED Notes (Signed)
Pt here for wound check, needs staples in back of head removed, stitches on upper lip removed, still co rib pain.

## 2012-08-05 NOTE — ED Notes (Signed)
Copy of previous referral information given to pt. Pt seen here on 12/18. Referral to Dr. Mort Sawyers office.

## 2012-08-15 ENCOUNTER — Encounter (HOSPITAL_COMMUNITY): Payer: Self-pay | Admitting: *Deleted

## 2012-08-15 ENCOUNTER — Emergency Department (HOSPITAL_COMMUNITY): Payer: Self-pay

## 2012-08-15 ENCOUNTER — Emergency Department (HOSPITAL_COMMUNITY)
Admission: EM | Admit: 2012-08-15 | Discharge: 2012-08-16 | Disposition: A | Discharge status: Home / Self Care | Payer: Self-pay | Attending: Emergency Medicine | Admitting: Emergency Medicine

## 2012-08-15 DIAGNOSIS — Y929 Unspecified place or not applicable: Secondary | ICD-10-CM | POA: Insufficient documentation

## 2012-08-15 DIAGNOSIS — S61409A Unspecified open wound of unspecified hand, initial encounter: Secondary | ICD-10-CM | POA: Insufficient documentation

## 2012-08-15 DIAGNOSIS — W268XXA Contact with other sharp object(s), not elsewhere classified, initial encounter: Secondary | ICD-10-CM | POA: Insufficient documentation

## 2012-08-15 DIAGNOSIS — R4789 Other speech disturbances: Secondary | ICD-10-CM | POA: Insufficient documentation

## 2012-08-15 DIAGNOSIS — Y9389 Activity, other specified: Secondary | ICD-10-CM | POA: Insufficient documentation

## 2012-08-15 DIAGNOSIS — F17200 Nicotine dependence, unspecified, uncomplicated: Secondary | ICD-10-CM | POA: Insufficient documentation

## 2012-08-15 DIAGNOSIS — Z8659 Personal history of other mental and behavioral disorders: Secondary | ICD-10-CM | POA: Insufficient documentation

## 2012-08-15 DIAGNOSIS — S61419A Laceration without foreign body of unspecified hand, initial encounter: Secondary | ICD-10-CM

## 2012-08-15 DIAGNOSIS — W01119A Fall on same level from slipping, tripping and stumbling with subsequent striking against unspecified sharp object, initial encounter: Secondary | ICD-10-CM | POA: Insufficient documentation

## 2012-08-15 DIAGNOSIS — Z8614 Personal history of Methicillin resistant Staphylococcus aureus infection: Secondary | ICD-10-CM | POA: Insufficient documentation

## 2012-08-15 MED ORDER — HYDROMORPHONE HCL PF 1 MG/ML IJ SOLN
1.0000 mg | Freq: Once | INTRAMUSCULAR | Status: AC
  Administered 2012-08-15: 1 mg via INTRAMUSCULAR
  Filled 2012-08-15: qty 1

## 2012-08-15 MED ORDER — LIDOCAINE-EPINEPHRINE (PF) 2 %-1:200000 IJ SOLN
INTRAMUSCULAR | Status: AC
  Administered 2012-08-15: 20 mL
  Filled 2012-08-15: qty 20

## 2012-08-15 NOTE — ED Notes (Signed)
Patient ambulated to bathroom and reported he felt as if he was going to get sick. Patient heard vomiting in bathroom. Patient reports, "something upset my stomach."

## 2012-08-15 NOTE — ED Notes (Signed)
Patient appears drowsy and has slurred speech. Pupils pinpoint at this time. Patient denies ETOH use, drug use, or taking any medication prior to coming to ED.

## 2012-08-15 NOTE — ED Notes (Signed)
Dr. Ignacia Palma notified of patient.

## 2012-08-15 NOTE — ED Notes (Addendum)
Pt reporting falling and cutting hand on piece of metal.  Pt reports injury about 1 hour ago. Reports tetanus shot about 1 month ago.  Denies ETOH or drug use.

## 2012-08-15 NOTE — ED Provider Notes (Signed)
History  This chart was scribed for Austin Skene, MD by Austin Bryant, ED Scribe. This patient was seen in room APA02/APA02 and the patient's care was started at 2306.  CSN: [id number]  Arrival date & time 08/15/12  2150   First MD Initiated Contact with Patient 08/15/12 2306      Chief Complaint  Patient presents with  . Extremity Laceration     The history is provided by the patient. No language interpreter was used.    Austin Bryant is a 27 y.o. male who presents to the Emergency Department complaining of an right handlaceration with associated numbness. He states he slipped and fell onto the edge of a piece of metal in his driveway. He states he is in severe pain and describes the pain as throbbing. He denies any alcohol or recreational drug use tonight. He reports taking his prescribed Aderall this morning.   Past Medical History  Diagnosis Date  . Headache   . Anxiety   . ADHD (attention deficit hyperactivity disorder)   . Hx MRSA infection 2008    of leg    Past Surgical History  Procedure Date  . No past surgeries   . Eye surgery   . Lacrimal tube insertion 10/19/2011    Procedure: LACRIMAL TUBE INSERTION;  Surgeon: Austin Simmonds, MD;  Location: [facility];  Service: Ophthalmology;  Laterality: Right;  Repair of Laceration of Lacrimal System Right Lower Lid; monostent placed into right lacrimal duct by Dr. Lita Bryant, 10/19/2011 @ 1009; lot# [id number]    Family History  Problem Relation Age of Onset  . Anesthesia problems Neg Hx   . Hypotension Neg Hx   . Malignant hyperthermia Neg Hx   . Pseudochol deficiency Neg Hx     History  Substance Use Topics  . Smoking status: Current Every Day Smoker -- 0.5 packs/day for 10 years    Types: Cigarettes  . Smokeless tobacco: Never Used  . Alcohol Use: No      Review of Systems  At least 10pt or greater review of systems completed and are negative except where specified in the HPI.   Allergies  Bee venom;  Ibuprofen; and Penicillins  Home Medications   Current Outpatient Rx  Name  Route  Sig  Dispense  Refill  . EPINEPHRINE 0.3 MG/0.3ML IJ DEVI   Intramuscular   Inject 0.3 mg into the muscle daily as needed. For severe allergic reaction           Triage Vitals: BP 137/89  Pulse 89  Resp 18  SpO2 100%  Physical Exam  Nursing notes reviewed.  Electronic medical record reviewed. VITAL SIGNS:   Filed Vitals:   08/15/12 2209 08/15/12 2332  BP: 137/89 135/71  Pulse: 89 82  Resp: 18 20  SpO2: 100% 100%   CONSTITUTIONAL: Awake, oriented, appears non-toxic HENT: Atraumatic, normocephalic, oral mucosa pink and moist, airway patent. Nares patent without drainage. External ears normal. EYES: Conjunctiva injected bilaterally-no ciliary flush, no photophobia, EOMI, PERRLA NECK: Trachea midline, non-tender, supple CARDIOVASCULAR: Normal heart rate, Normal rhythm, No murmurs, rubs, gallops PULMONARY/CHEST: Clear to auscultation, no rhonchi, wheezes, or rales. Symmetrical breath sounds. Non-tender. ABDOMINAL: Non-distended, soft, non-tender - no rebound or guarding.  BS normal. NEUROLOGIC: Non-focal, moving all four extremities, no gross sensory or motor deficits. EXTREMITIES: No clubbing, cyanosis, or edema. 7 cm laceration to the left palm. Tourniquet applied to arm and wound examined under bloodless field. Laceration is superficial and extends into the palm or fat  pad it does not penetrate below the fat - no involvement of the superficial arch or flexor tendon sheaths. No nerve involvement. Patient's flexion was tested in both FDS and FDP isolation of all fingers. Abduction abduction and flexion of thumb is intact and 5 out of 5 in strength. Dynamic proprioceptive testing was tested in both radial and ulnar aspects of each finger and thumb using a paperclip. No neurologic or vascular compromise of the hand. SKIN: Warm, Dry, No erythema, No rash  ED Course  LACERATION REPAIR Date/Time:  08/16/2012 2:11 AM Performed by: Austin Bryant Authorized by: Austin Bryant Consent: Verbal consent obtained. Risks and benefits: risks, benefits and alternatives were discussed Consent given by: patient Patient identity confirmed: verbally with patient Time out: Immediately prior to procedure a "time out" was called to verify the correct patient, procedure, equipment, support staff and site/side marked as required. Body area: upper extremity (Left palm) Laceration length: 7 cm Foreign bodies: no foreign bodies Tendon involvement: none Nerve involvement: none Vascular damage: Very small arteriole at the surface was causing  troublesome bleeding and ligated. Laceration did not extend beneath the adductor pollicis, superficial arch nonaffected. Anesthesia: local infiltration Local anesthetic: lidocaine 2% with epinephrine Anesthetic total: 15 ml Patient sedated: no Preparation: Patient was prepped and draped in the usual sterile fashion. Irrigation solution: tap water Irrigation method: tap Amount of cleaning: extensive Debridement: none Degree of undermining: none Skin closure: 3-0 nylon Number of sutures: 7 Technique: simple Approximation: loose Approximation difficulty: simple Dressing: 4x4 sterile gauze and antibiotic ointment Patient tolerance: Patient tolerated the procedure well with no immediate complications.   (including critical care time)  DIAGNOSTIC STUDIES: Oxygen Saturation is 1000% on room air, normal by my interpretation.    COORDINATION OF CARE:  11:19 PM: Discussed treatment plan which includes pain medication and a laceration repair with pt at bedside and pt agreed to plan.     Labs Reviewed - No data to display Dg Hand Complete Left  08/16/2012  *RADIOLOGY REPORT*  Clinical Data: Laceration across palm of the hand.  LEFT HAND - COMPLETE 3+ VIEW  Comparison: 09/15/2011  Findings: No bony abnormality.  No fracture, subluxation or dislocation.  No  radiopaque foreign body.  Joint spaces are maintained.  IMPRESSION: No bony abnormality or radiopaque foreign body.   Original Report Authenticated By: Austin Bryant, M.D.      1. Laceration of palm without complication      Medications  traMADol (ULTRAM) 50 MG tablet (not administered)  lidocaine-EPINEPHrine (XYLOCAINE W/EPI) 2 %-1:200000 (PF) injection (20 mL  Given by Other 08/15/12 2332)  HYDROmorphone (DILAUDID) injection 1 mg (1 mg Intramuscular Given 08/15/12 2331)  lidocaine-EPINEPHrine (XYLOCAINE W/EPI) 2 %-1:200000 (PF) injection (10 mL  Given 08/16/12 0207)    MDM  THELBERT GARTIN is a 27 y.o. male presents with laceration to left palm on metal. He is up-to-date on his tetanus. Patient appears intoxicated with some kind of substance, he does not smell of alcohol but eyes are very bloodshot.  Laceration repaired - patient tolerated procedure fairly well-patient does seem hyperesthetic. He asked for pain medicine repeatedly at the end of the procedure, was offered tramadol but says he did not want it would like something stronger. Patient does have a history of apparent drug-seeking behavior. We'll not prescribe any narcotics for home use. Patient is cautioned against signs of infection and to return to emergency Department should any signs of infection present themselves including but not limited to fevers, chills, redness of  the area, worsening pain, draining pus, bleeding or swelling or any other concerning symptoms patient understands and accepts the medical plan as it's been dictated and we discharged home stable and in good condition    I personally performed the services described in this documentation, which was scribed in my presence. The recorded information has been reviewed and is accurate. Austin Bryant, M.D.      Austin Skene, MD 08/17/12 [phone]

## 2012-08-15 NOTE — ED Notes (Signed)
Patient wound bled through dressing applied at triage. New pressure dressing applied. Delay explained to patient.

## 2012-08-16 MED ORDER — LIDOCAINE-EPINEPHRINE (PF) 2 %-1:200000 IJ SOLN
INTRAMUSCULAR | Status: AC
  Administered 2012-08-16: 10 mL
  Filled 2012-08-16: qty 20

## 2012-08-16 MED ORDER — TRAMADOL HCL 50 MG PO TABS: 50.0000 mg | ORAL_TABLET | Freq: Four times a day (QID) | ORAL | Status: DC | PRN

## 2012-08-16 NOTE — ED Notes (Signed)
Patient verbalizing he was upset that he was going to be given only tramadol for pain. Patient pacing around room cursing with girlfriend. Patient standing directly in front of pediatric crash cart and this nurse heard a popping noise. Walked into patient's room, patient moving away from crash cart. This nurse noticed first drawer on pediatric crash cart was slightly opened, and red lock had been broken. This nurse had placed crash cart in room just prior to patient's arrival due to crash cart being exchanged from prior use. No locks were broken on crash cart at that time. AC and charge nurse notified.

## 2012-08-16 NOTE — ED Notes (Signed)
Ran pt's left hand under running water per Dr. Hoyt Koch instructions.

## 2012-08-16 NOTE — ED Notes (Signed)
Prior to discharge pt became agitated due to Dr. Rulon Abide prescribing Tramadol for discharge pain medications.  Pt stated he needed something stronger because he "never takes pain medicine and I'm a wussy when it comes to pain".  Dr. Rulon Abide was notified.  Pt was upset that the decision remained the same.  Pt did allow this RN to wrap his hand and give discharge instructions before leaving.
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
## 2013-06-07 IMAGING — CR DG HAND COMPLETE 3+V*L*
3 series · 3 of 3 positions shown · non-contrast
Comparison: 09/15/2011

CLINICAL DATA: Laceration across palm of the hand.

LEFT HAND - COMPLETE 3+ VIEW

[view not recorded (1 of 3)]
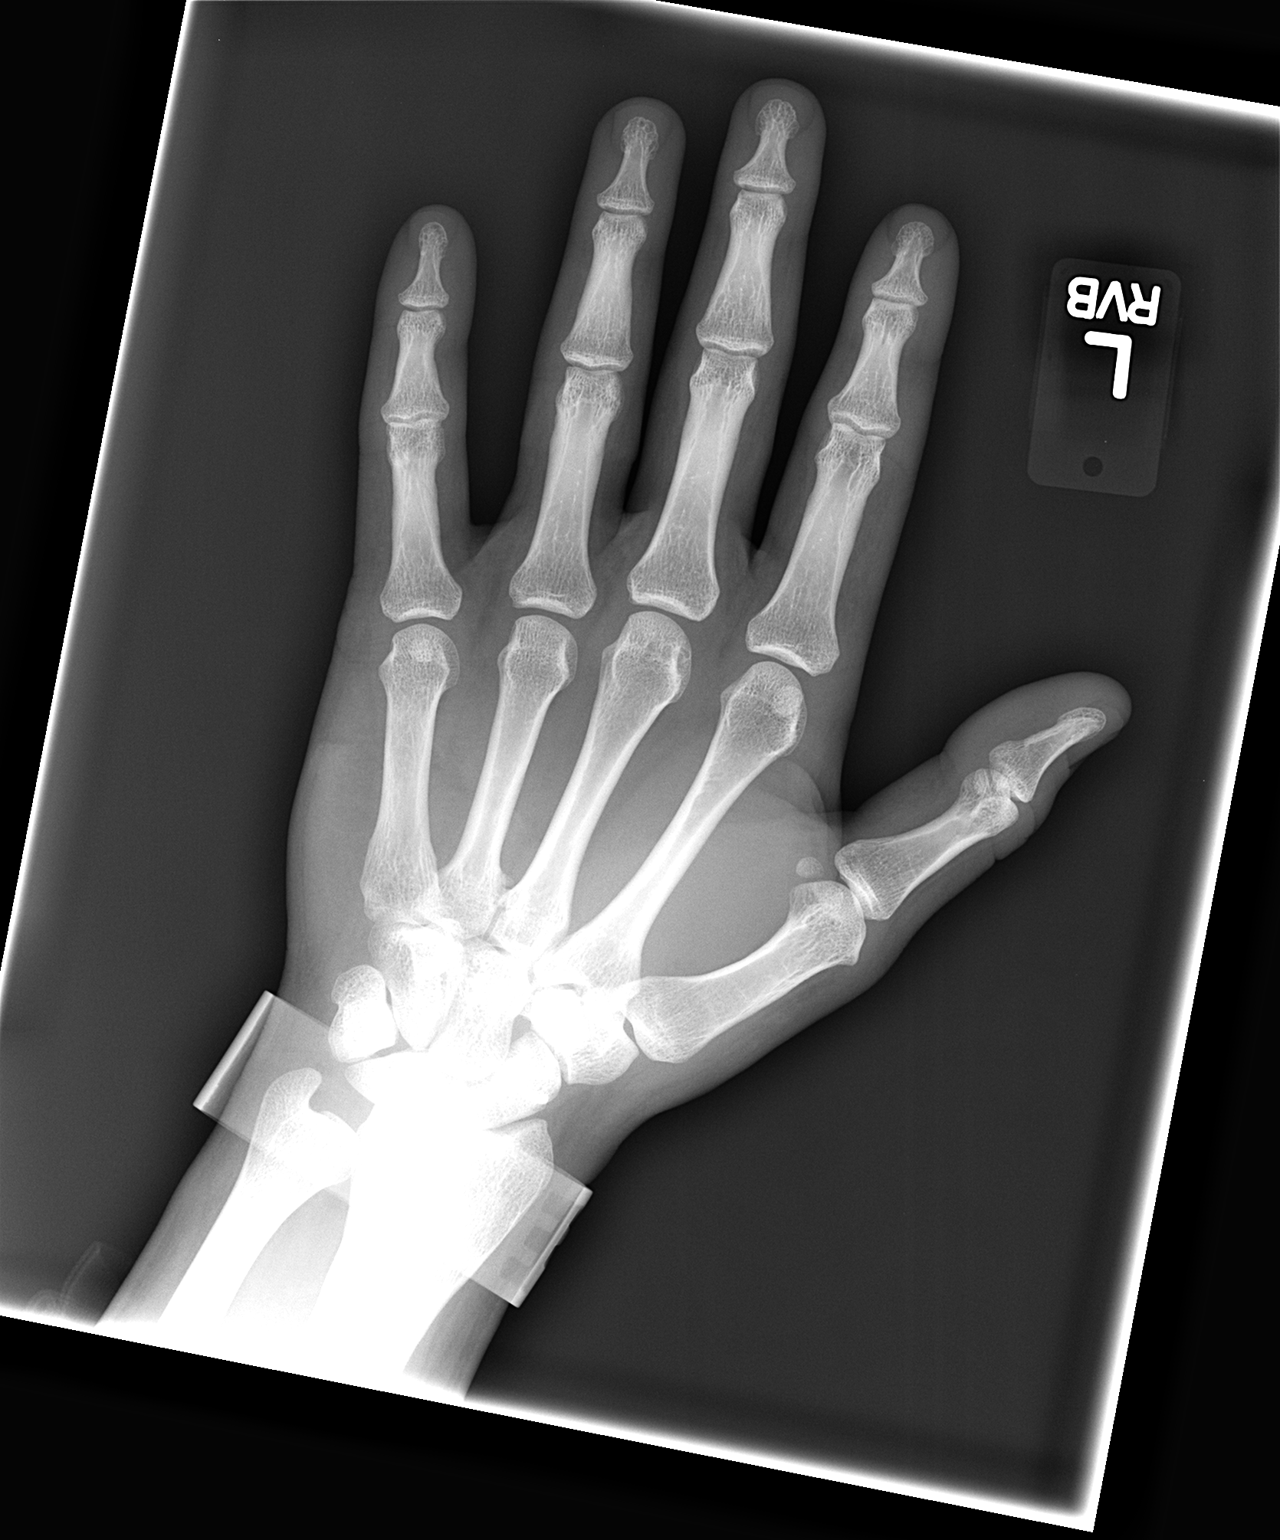

[view not recorded (2 of 3)]
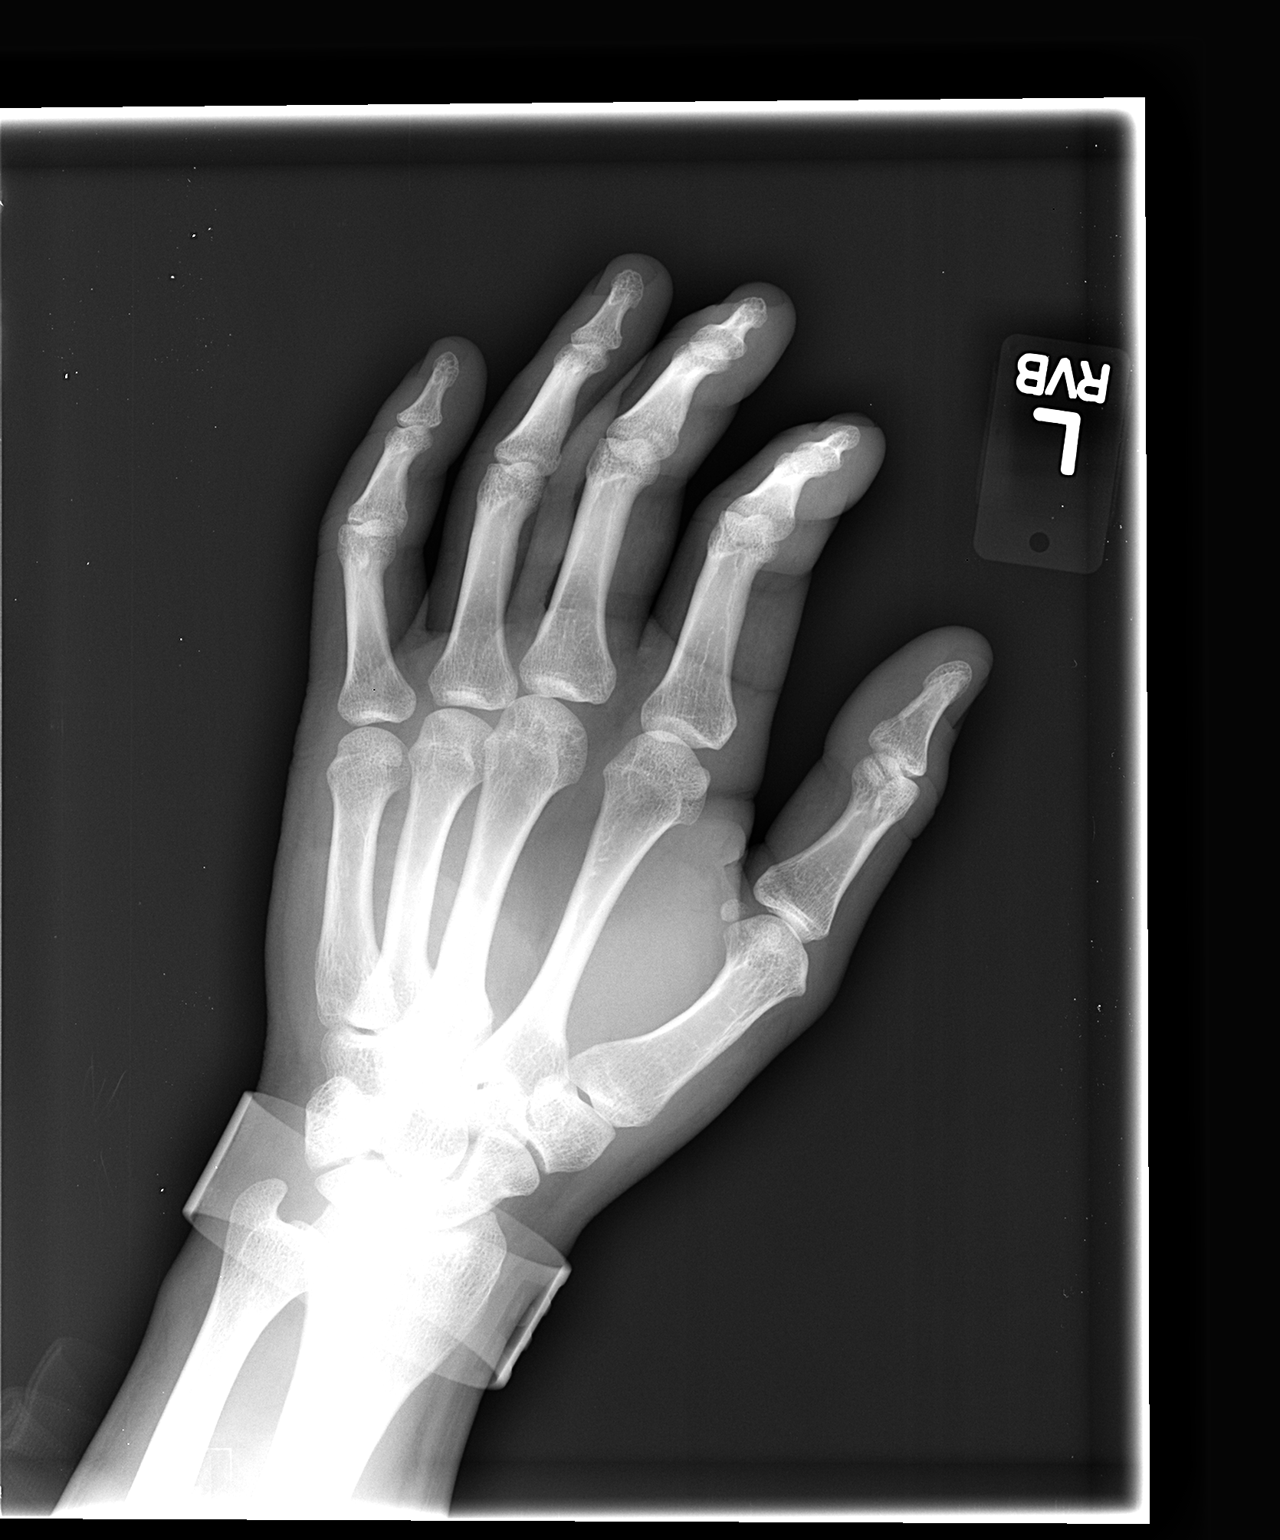

[view not recorded (3 of 3)]
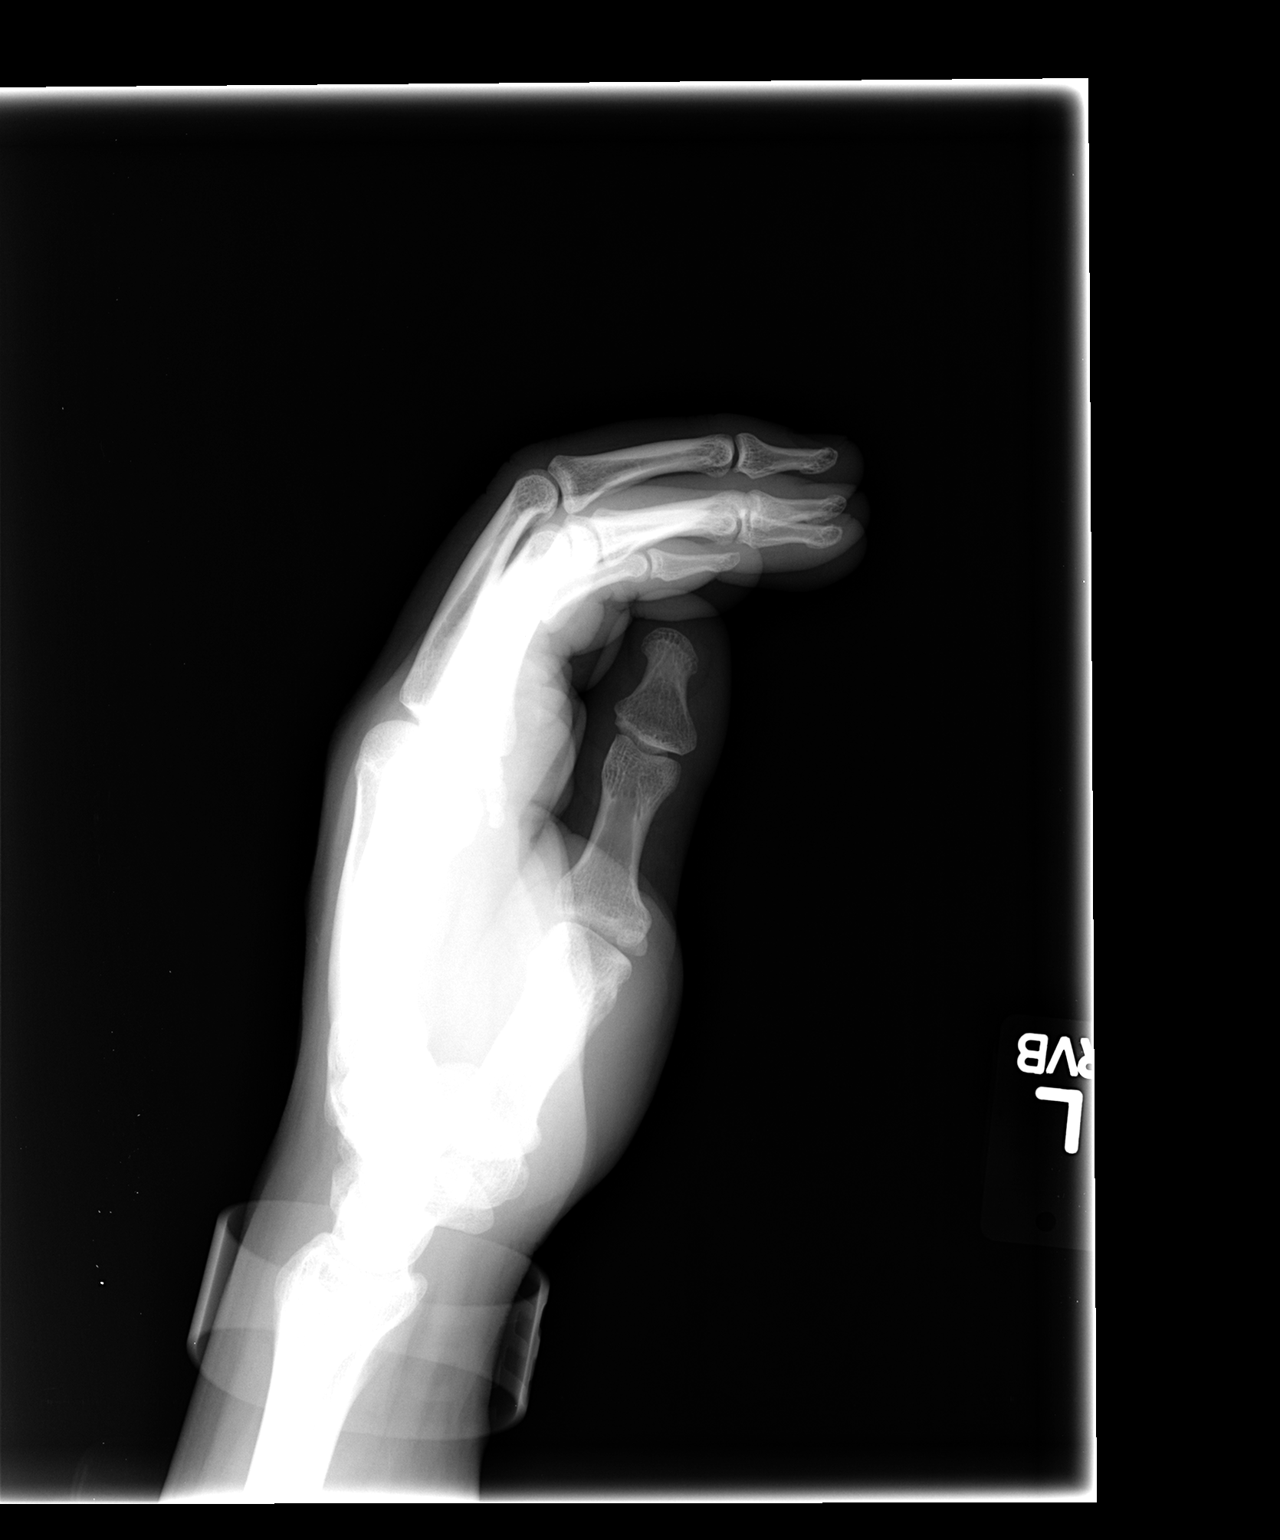

[3 of 3 positions shown; findings below may reference images not displayed]

FINDINGS: No bony abnormality.  No fracture, subluxation or
dislocation.  No radiopaque foreign body.  Joint spaces are
maintained.
IMPRESSION: No bony abnormality or radiopaque foreign body.

## 2013-08-06 DIAGNOSIS — R569 Unspecified convulsions: Secondary | ICD-10-CM

## 2013-08-06 DIAGNOSIS — S069X9A Unspecified intracranial injury with loss of consciousness of unspecified duration, initial encounter: Secondary | ICD-10-CM

## 2013-08-06 HISTORY — DX: Unspecified intracranial injury with loss of consciousness of unspecified duration, initial encounter: S06.9X9A

## 2013-08-06 HISTORY — DX: Unspecified convulsions: R56.9

## 2015-04-07 ENCOUNTER — Encounter (HOSPITAL_COMMUNITY): Payer: Self-pay | Admitting: Emergency Medicine

## 2015-04-07 ENCOUNTER — Emergency Department (HOSPITAL_COMMUNITY)
Admission: EM | Admit: 2015-04-07 | Discharge: 2015-04-07 | Disposition: A | Discharge status: Home / Self Care | Payer: Self-pay | Attending: Emergency Medicine | Admitting: Emergency Medicine

## 2015-04-07 DIAGNOSIS — Z8614 Personal history of Methicillin resistant Staphylococcus aureus infection: Secondary | ICD-10-CM | POA: Insufficient documentation

## 2015-04-07 DIAGNOSIS — Z880 Allergy status to penicillin: Secondary | ICD-10-CM | POA: Insufficient documentation

## 2015-04-07 DIAGNOSIS — S39012A Strain of muscle, fascia and tendon of lower back, initial encounter: Secondary | ICD-10-CM | POA: Insufficient documentation

## 2015-04-07 DIAGNOSIS — X58XXXA Exposure to other specified factors, initial encounter: Secondary | ICD-10-CM | POA: Insufficient documentation

## 2015-04-07 DIAGNOSIS — Z8659 Personal history of other mental and behavioral disorders: Secondary | ICD-10-CM | POA: Insufficient documentation

## 2015-04-07 DIAGNOSIS — Y9289 Other specified places as the place of occurrence of the external cause: Secondary | ICD-10-CM | POA: Insufficient documentation

## 2015-04-07 DIAGNOSIS — Z720 Tobacco use: Secondary | ICD-10-CM | POA: Insufficient documentation

## 2015-04-07 DIAGNOSIS — Y93F2 Activity, caregiving, lifting: Secondary | ICD-10-CM | POA: Insufficient documentation

## 2015-04-07 DIAGNOSIS — Y990 Civilian activity done for income or pay: Secondary | ICD-10-CM | POA: Insufficient documentation

## 2015-04-07 LAB — URINALYSIS, ROUTINE W REFLEX MICROSCOPIC
BILIRUBIN URINE: NEGATIVE
GLUCOSE, UA: NEGATIVE mg/dL
Hgb urine dipstick: NEGATIVE
KETONES UR: NEGATIVE mg/dL
Leukocytes, UA: NEGATIVE
NITRITE: NEGATIVE
PH: 5.5 (ref 5.0–8.0)
Protein, ur: NEGATIVE mg/dL
Specific Gravity, Urine: 1.03 (ref 1.005–1.030)
Urobilinogen, UA: 1 mg/dL (ref 0.0–1.0)

## 2015-04-07 MED ORDER — ACETAMINOPHEN 500 MG PO TABS: 500.0000 mg | ORAL_TABLET | Freq: Four times a day (QID) | ORAL | Status: DC | PRN

## 2015-04-07 MED ORDER — CYCLOBENZAPRINE HCL 10 MG PO TABS: 10.0000 mg | ORAL_TABLET | Freq: Three times a day (TID) | ORAL | Status: DC | PRN

## 2015-04-07 MED ORDER — CYCLOBENZAPRINE HCL 10 MG PO TABS
5.0000 mg | ORAL_TABLET | Freq: Once | ORAL | Status: AC
  Administered 2015-04-07: 5 mg via ORAL
  Filled 2015-04-07: qty 1

## 2015-04-07 MED ORDER — ACETAMINOPHEN 500 MG PO TABS
1000.0000 mg | ORAL_TABLET | Freq: Once | ORAL | Status: AC
  Administered 2015-04-07: 1000 mg via ORAL
  Filled 2015-04-07: qty 2

## 2015-04-07 NOTE — ED Provider Notes (Signed)
CSN: [id number]     Arrival date & time 04/07/15  0446 History   First MD Initiated Contact with Patient 04/07/15 [phone]     Chief Complaint  Patient presents with  . Back Pain      HPI Patient reports lifting a lot at work yesterday and straining his left low back.  He denies dysuria or urinary frequency.  He reports his pain is moderate to severe in severity and located in his left flank.  He is concerned about the possibility of a urinary tract infection and kidney infection as his had multiple family members with these.  No history of kidney stones.  His pain is moderate to severe in severity.  Denies nausea vomiting.  No fevers or chills.  No bowel or bladder complaints.   Past Medical History  Diagnosis Date  . Headache(784.0)   . Anxiety   . ADHD (attention deficit hyperactivity disorder)   . Hx MRSA infection 2008    of leg   Past Surgical History  Procedure Laterality Date  . No past surgeries    . Eye surgery    . Lacrimal tube insertion  10/19/2011    Procedure: LACRIMAL TUBE INSERTION;  Surgeon: Susa Simmonds, MD;  Location: [facility];  Service: Ophthalmology;  Laterality: Right;  Repair of Laceration of Lacrimal System Right Lower Lid; monostent placed into right lacrimal duct by Dr. Lita Mains, 10/19/2011 @ 1009; lot# [id number]   Family History  Problem Relation Age of Onset  . Anesthesia problems Neg Hx   . Hypotension Neg Hx   . Malignant hyperthermia Neg Hx   . Pseudochol deficiency Neg Hx    Social History  Substance Use Topics  . Smoking status: Current Every Day Smoker -- 0.50 packs/day for 10 years    Types: Cigarettes  . Smokeless tobacco: Never Used  . Alcohol Use: No    Review of Systems  All other systems reviewed and are negative.     Allergies  Bee venom; Ibuprofen; and Penicillins  Home Medications   Prior to Admission medications   Medication Sig Start Date End Date Taking? Authorizing Provider  EPINEPHrine (EPIPEN) 0.3 mg/0.3 mL DEVI Inject  0.3 mg into the muscle daily as needed. For severe allergic reaction   Yes Historical Provider, MD  acetaminophen (TYLENOL) 500 MG tablet Take 1 tablet (500 mg total) by mouth every 6 (six) hours as needed. 04/07/15   Azalia Bilis, MD  cyclobenzaprine (FLEXERIL) 10 MG tablet Take 1 tablet (10 mg total) by mouth 3 (three) times daily as needed for muscle spasms. 04/07/15   Azalia Bilis, MD   BP 144/75 mmHg  Pulse 92  Temp(Src) 97.9 F (36.6 C) (Oral)  SpO2 100% Physical Exam  Constitutional: He is oriented to person, place, and time. He appears well-developed and well-nourished.  HENT:  Head: Normocephalic and atraumatic.  Eyes: EOM are normal.  Neck: Normal range of motion.  Cardiovascular: Normal rate, regular rhythm, normal heart sounds and intact distal pulses.   Pulmonary/Chest: Effort normal and breath sounds normal. No respiratory distress.  Abdominal: Soft. He exhibits no distension. There is no tenderness.  Musculoskeletal: Normal range of motion.  Mild paralumbar tenderness on the left with mild spasm.  No thoracic or lumbar point tenderness.  Neurological: He is alert and oriented to person, place, and time.  Skin: Skin is warm and dry.  Psychiatric: He has a normal mood and affect. Judgment normal.  Nursing note and vitals reviewed.   ED Course  Procedures (including critical care time) Labs Review Labs Reviewed  URINALYSIS, ROUTINE W REFLEX MICROSCOPIC (NOT AT [facility])    Imaging Review No results found. I have personally reviewed and evaluated these images and lab results as part of my medical decision-making.   EKG Interpretation None      MDM   Final diagnoses:  Lumbar strain, initial encounter    Normal lower extremity neurologic exam. No bowel or bladder complaints. No back pain red flags. Likely musculoskeletal back pain. Doubt spinal epidural abscess. Doubt cauda equina. Doubt abdominal aortic aneurysm     Azalia Bilis, MD 04/07/15 [phone]

## 2015-04-07 NOTE — ED Notes (Signed)
Pt states that he was lifting a log at work yesterday when he sustained a lower back injury.

## 2015-04-07 NOTE — Discharge Instructions (Signed)
Back Pain, Adult Low back pain is very common. About 1 in 5 people have back pain.The cause of low back pain is rarely dangerous. The pain often gets better over time.About half of people with a sudden onset of back pain feel better in just 2 weeks. About 8 in 10 people feel better by 6 weeks.  CAUSES Some common causes of back pain include:  Strain of the muscles or ligaments supporting the spine.  Wear and tear (degeneration) of the spinal discs.  Arthritis.  Direct injury to the back. DIAGNOSIS Most of the time, the direct cause of low back pain is not known.However, back pain can be treated effectively even when the exact cause of the pain is unknown.Answering your caregiver's questions about your overall health and symptoms is one of the most accurate ways to make sure the cause of your pain is not dangerous. If your caregiver needs more information, he or she may order lab work or imaging tests (X-rays or MRIs).However, even if imaging tests show changes in your back, this usually does not require surgery. HOME CARE INSTRUCTIONS For many people, back pain returns.Since low back pain is rarely dangerous, it is often a condition that people can learn to manageon their own.   Remain active. It is stressful on the back to sit or stand in one place. Do not sit, drive, or stand in one place for more than 30 minutes at a time. Take short walks on level surfaces as soon as pain allows.Try to increase the length of time you walk each day.  Do not stay in bed.Resting more than 1 or 2 days can delay your recovery.  Do not avoid exercise or work.Your body is made to move.It is not dangerous to be active, even though your back may hurt.Your back will likely heal faster if you return to being active before your pain is gone.  Pay attention to your body when you bend and lift. Many people have less discomfortwhen lifting if they bend their knees, keep the load close to their bodies,and  avoid twisting. Often, the most comfortable positions are those that put less stress on your recovering back.  Find a comfortable position to sleep. Use a firm mattress and lie on your side with your knees slightly bent. If you lie on your back, put a pillow under your knees.  Only take over-the-counter or prescription medicines as directed by your caregiver. Over-the-counter medicines to reduce pain and inflammation are often the most helpful.Your caregiver may prescribe muscle relaxant drugs.These medicines help dull your pain so you can more quickly return to your normal activities and healthy exercise.  Put ice on the injured area.  Put ice in a plastic bag.  Place a towel between your skin and the bag.  Leave the ice on for 15-20 minutes, 03-04 times a day for the first 2 to 3 days. After that, ice and heat may be alternated to reduce pain and spasms.  Ask your caregiver about trying back exercises and gentle massage. This may be of some benefit.  Avoid feeling anxious or stressed.Stress increases muscle tension and can worsen back pain.It is important to recognize when you are anxious or stressed and learn ways to manage it.Exercise is a great option. SEEK MEDICAL CARE IF:  You have pain that is not relieved with rest or medicine.  You have pain that does not improve in 1 week.  You have new symptoms.  You are generally not feeling well. SEEK   IMMEDIATE MEDICAL CARE IF:   You have pain that radiates from your back into your legs.  You develop new bowel or bladder control problems.  You have unusual weakness or numbness in your arms or legs.  You develop nausea or vomiting.  You develop abdominal pain.  You feel faint. Document Released: 07/23/2005 Document Revised: 01/22/2012 Document Reviewed: 11/24/2013 [employer] Patient Information 2015 [employer]. This information is not intended to replace advice given to you by your health care provider. Make sure you  discuss any questions you have with your health care provider.  

## 2015-04-07 NOTE — ED Notes (Signed)
Pt left with all belongings and ambulated out of treatment area.  

## 2015-10-13 ENCOUNTER — Emergency Department (HOSPITAL_COMMUNITY): Payer: Self-pay

## 2015-10-13 ENCOUNTER — Emergency Department (HOSPITAL_COMMUNITY)
Admission: EM | Admit: 2015-10-13 | Discharge: 2015-10-13 | Disposition: A | Discharge status: Home / Self Care | Payer: Self-pay | Attending: Emergency Medicine | Admitting: Emergency Medicine

## 2015-10-13 ENCOUNTER — Encounter (HOSPITAL_COMMUNITY): Payer: Self-pay | Admitting: Emergency Medicine

## 2015-10-13 DIAGNOSIS — W268XXA Contact with other sharp object(s), not elsewhere classified, initial encounter: Secondary | ICD-10-CM | POA: Insufficient documentation

## 2015-10-13 DIAGNOSIS — F17210 Nicotine dependence, cigarettes, uncomplicated: Secondary | ICD-10-CM | POA: Insufficient documentation

## 2015-10-13 DIAGNOSIS — Z23 Encounter for immunization: Secondary | ICD-10-CM | POA: Insufficient documentation

## 2015-10-13 DIAGNOSIS — S91114A Laceration without foreign body of right lesser toe(s) without damage to nail, initial encounter: Secondary | ICD-10-CM | POA: Insufficient documentation

## 2015-10-13 DIAGNOSIS — Y929 Unspecified place or not applicable: Secondary | ICD-10-CM | POA: Insufficient documentation

## 2015-10-13 DIAGNOSIS — IMO0002 Reserved for concepts with insufficient information to code with codable children: Secondary | ICD-10-CM

## 2015-10-13 DIAGNOSIS — Y939 Activity, unspecified: Secondary | ICD-10-CM | POA: Insufficient documentation

## 2015-10-13 DIAGNOSIS — Y999 Unspecified external cause status: Secondary | ICD-10-CM | POA: Insufficient documentation

## 2015-10-13 DIAGNOSIS — Z79899 Other long term (current) drug therapy: Secondary | ICD-10-CM | POA: Insufficient documentation

## 2015-10-13 MED ORDER — OXYCODONE-ACETAMINOPHEN 5-325 MG PO TABS
2.0000 | ORAL_TABLET | Freq: Once | ORAL | Status: AC
  Administered 2015-10-13: 2 via ORAL
  Filled 2015-10-13: qty 2

## 2015-10-13 MED ORDER — OXYCODONE-ACETAMINOPHEN 5-325 MG PO TABS: 1.0000 | ORAL_TABLET | ORAL | Status: DC | PRN

## 2015-10-13 MED ORDER — LIDOCAINE HCL (PF) 2 % IJ SOLN
10.0000 mL | Freq: Once | INTRAMUSCULAR | Status: AC
  Administered 2015-10-13: 10 mL via INTRADERMAL
  Filled 2015-10-13: qty 10

## 2015-10-13 MED ORDER — TETANUS-DIPHTH-ACELL PERTUSSIS 5-2.5-18.5 LF-MCG/0.5 IM SUSP
0.5000 mL | Freq: Once | INTRAMUSCULAR | Status: AC
  Administered 2015-10-13: 0.5 mL via INTRAMUSCULAR
  Filled 2015-10-13: qty 0.5

## 2015-10-13 NOTE — ED Provider Notes (Signed)
CSN: [id number]     Arrival date & time 10/13/15  1657 History   First MD Initiated Contact with Patient 10/13/15 1726     Chief Complaint  Patient presents with  . Toe Injury     (Consider location/radiation/quality/duration/timing/severity/associated sxs/prior Treatment) HPI  Austin Bryant is a 30 y.o. male who presents to the Emergency Department complaining of laceration to right fourth toe after stepping on a sharp edge of a metal can lid.  He reports pain with weight bearing and bleeding.  He states that he soaked his foot in  Water and cleaned the wound.  He denies numbness or swelling of the foot.  Last Td is unknown. Denies possible foreign bodies.     Past Medical History  Diagnosis Date  . Headache(784.0)   . Anxiety   . ADHD (attention deficit hyperactivity disorder)   . Hx MRSA infection 2008    of leg   Past Surgical History  Procedure Laterality Date  . No past surgeries    . Eye surgery    . Lacrimal tube insertion  10/19/2011    Procedure: LACRIMAL TUBE INSERTION;  Surgeon: Susa Simmonds, MD;  Location: [facility];  Service: Ophthalmology;  Laterality: Right;  Repair of Laceration of Lacrimal System Right Lower Lid; monostent placed into right lacrimal duct by Dr. Lita Mains, 10/19/2011 @ 1009; lot# [id number]   Family History  Problem Relation Age of Onset  . Anesthesia problems Neg Hx   . Hypotension Neg Hx   . Malignant hyperthermia Neg Hx   . Pseudochol deficiency Neg Hx    Social History  Substance Use Topics  . Smoking status: Current Every Day Smoker -- 0.50 packs/day for 10 years    Types: Cigarettes  . Smokeless tobacco: Never Used  . Alcohol Use: No    Review of Systems  Constitutional: Negative for fever and chills.  Musculoskeletal: Negative for back pain, joint swelling and arthralgias.  Skin: Positive for wound (laceration right fourth toe).       Laceration   Neurological: Negative for dizziness, weakness and numbness.  Hematological: Does not  bruise/bleed easily.  All other systems reviewed and are negative.     Allergies  Bee venom; Ibuprofen; and Penicillins  Home Medications   Prior to Admission medications   Medication Sig Start Date End Date Taking? Authorizing Provider  acetaminophen (TYLENOL) 500 MG tablet Take 1 tablet (500 mg total) by mouth every 6 (six) hours as needed. 04/07/15   Azalia Bilis, MD  cyclobenzaprine (FLEXERIL) 10 MG tablet Take 1 tablet (10 mg total) by mouth 3 (three) times daily as needed for muscle spasms. 04/07/15   Azalia Bilis, MD  EPINEPHrine (EPIPEN) 0.3 mg/0.3 mL DEVI Inject 0.3 mg into the muscle daily as needed. For severe allergic reaction    Historical Provider, MD   BP 139/84 mmHg  Pulse 99  Temp(Src) 99.5 F (37.5 C) (Oral)  Resp 18  Ht  (1.93 m)  Wt 113.399 kg  BMI 30.44 kg/m2  SpO2 99% Physical Exam  Constitutional: He is oriented to person, place, and time. He appears well-developed and well-nourished. No distress.  HENT:  Head: Normocephalic and atraumatic.  Cardiovascular: Normal rate, regular rhythm, normal heart sounds and intact distal pulses.   No murmur heard. Pulmonary/Chest: Effort normal and breath sounds normal. No respiratory distress.  Musculoskeletal: He exhibits no edema or tenderness.  Neurological: He is alert and oriented to person, place, and time. He exhibits normal muscle tone. Coordination  normal.  Skin: Skin is warm. Laceration noted.  Laceration to the plantar surface of the distal right fourth toe.  Bleeding controlled.  No  FB.  Distal sensation intact.    Nursing note and vitals reviewed.   ED Course  Procedures (including critical care time) Labs Review Labs Reviewed - No data to display  Imaging Review Dg Toe 4th Right  10/13/2015  CLINICAL DATA:  Cut fourth toe on a soup can at 1500 hours today, pain radiating to metatarsals EXAM: RIGHT FOURTH TOE COMPARISON:  None FINDINGS: Osseous mineralization normal. Joint spaces preserved. Mild  soft tissue swelling at distal phalanx. No acute fracture, dislocation or bone destruction. No radiopaque foreign bodies identified. IMPRESSION: No acute osseous abnormalities. Electronically Signed   By: Ulyses SouthwardMark  Boles M.D.   On: 10/13/2015 18:00    I have personally reviewed and evaluated these images and lab results as part of my medical decision-making.   EKG Interpretation None       LACERATION REPAIR Performed by: Karely Hurtado L. Authorized by: Maxwell CaulRIPLETT,Jansen Goodpasture L. Consent: Verbal consent obtained. Risks and benefits: risks, benefits and alternatives were discussed Consent given by: patient Patient identity confirmed: provided demographic data Prepped and Draped in normal sterile fashion Wound explored  Laceration Location: right fourth toe  Laceration Length: 2 cm  No Foreign Bodies seen or palpated  Anesthesia: digital block Local anesthetic: lidocaine 2% w/o epinephrine  Anesthetic total: 2 ml  Irrigation method: syringe Amount of cleaning: standard  Skin closure: 4-0 prolene  Number of sutures: 5  Technique: simple interrupted  Patient tolerance: Patient tolerated the procedure well with no immediate complications.  MDM   Final diagnoses:  Laceration  Foreign body (FB) in soft tissue    Wound cleaned, Td updated.  Bleeding controlled.  XR neg for fx of FB.  Wound edges well approximated.  Wound care instructions given.  Sutures out in 10 days, pt agrees to care plan.      [name], PA-C 10/16/15 2245  Lavera Guiseana Duo Liu, MD 10/17/15 1710

## 2015-10-13 NOTE — ED Notes (Signed)
Injury to right fourth toe. Stepped on a piece of glass.  Rates pain 1/10.

## 2015-10-13 NOTE — ED Notes (Signed)
Austin Bryant at bedside. 

## 2016-01-07 ENCOUNTER — Encounter (HOSPITAL_COMMUNITY): Payer: Self-pay

## 2016-01-07 ENCOUNTER — Emergency Department (HOSPITAL_COMMUNITY)
Admission: EM | Admit: 2016-01-07 | Discharge: 2016-01-07 | Disposition: A | Discharge status: Home / Self Care | Payer: Self-pay | Attending: Emergency Medicine | Admitting: Emergency Medicine

## 2016-01-07 DIAGNOSIS — F909 Attention-deficit hyperactivity disorder, unspecified type: Secondary | ICD-10-CM | POA: Insufficient documentation

## 2016-01-07 DIAGNOSIS — T40604A Poisoning by unspecified narcotics, undetermined, initial encounter: Secondary | ICD-10-CM | POA: Insufficient documentation

## 2016-01-07 DIAGNOSIS — F17210 Nicotine dependence, cigarettes, uncomplicated: Secondary | ICD-10-CM | POA: Insufficient documentation

## 2016-01-07 LAB — COMPREHENSIVE METABOLIC PANEL
ALBUMIN: 3.9 g/dL (ref 3.5–5.0)
ALT: 103 U/L — ABNORMAL HIGH (ref 17–63)
ANION GAP: 6 (ref 5–15)
AST: 53 U/L — ABNORMAL HIGH (ref 15–41)
Alkaline Phosphatase: 57 U/L (ref 38–126)
BUN: 11 mg/dL (ref 6–20)
CO2: 27 mmol/L (ref 22–32)
Calcium: 8.7 mg/dL — ABNORMAL LOW (ref 8.9–10.3)
Chloride: 105 mmol/L (ref 101–111)
Creatinine, Ser: 0.94 mg/dL (ref 0.61–1.24)
GFR calc Af Amer: >60 mL/min (ref >60)
GFR calc non Af Amer: >60 mL/min (ref >60)
GLUCOSE: 128 mg/dL — AB (ref 65–99)
POTASSIUM: 3.4 mmol/L — AB (ref 3.5–5.1)
SODIUM: 138 mmol/L (ref 135–145)
TOTAL PROTEIN: 7 g/dL (ref 6.5–8.1)
Total Bilirubin: 0.3 mg/dL (ref 0.3–1.2)

## 2016-01-07 LAB — URINALYSIS, ROUTINE W REFLEX MICROSCOPIC
Bilirubin Urine: NEGATIVE
Glucose, UA: NEGATIVE mg/dL
Hgb urine dipstick: NEGATIVE
KETONES UR: NEGATIVE mg/dL
LEUKOCYTES UA: NEGATIVE
NITRITE: NEGATIVE
PH: 6 (ref 5.0–8.0)
Protein, ur: NEGATIVE mg/dL
SPECIFIC GRAVITY, URINE: 1.02 (ref 1.005–1.030)

## 2016-01-07 LAB — CBC WITH DIFFERENTIAL/PLATELET
BASOS PCT: 0 %
Basophils Absolute: 0 10*3/uL (ref 0.0–0.1)
EOS ABS: 0.2 10*3/uL (ref 0.0–0.7)
Eosinophils Relative: 2 %
HCT: 40.4 % (ref 39.0–52.0)
Hemoglobin: 13.2 g/dL (ref 13.0–17.0)
Lymphocytes Relative: 35 %
Lymphs Abs: 2.2 10*3/uL (ref 0.7–4.0)
MCH: 31.6 pg (ref 26.0–34.0)
MCHC: 32.7 g/dL (ref 30.0–36.0)
MCV: 96.7 fL (ref 78.0–100.0)
MONO ABS: 0.4 10*3/uL (ref 0.1–1.0)
MONOS PCT: 7 %
NEUTROS PCT: 56 %
Neutro Abs: 3.4 10*3/uL (ref 1.7–7.7)
Platelets: 227 10*3/uL (ref 150–400)
RBC: 4.18 MIL/uL — ABNORMAL LOW (ref 4.22–5.81)
RDW: 12.4 % (ref 11.5–15.5)
WBC: 6.2 10*3/uL (ref 4.0–10.5)

## 2016-01-07 LAB — ETHANOL: Alcohol, Ethyl (B): <5 mg/dL (ref <5)

## 2016-01-07 MED ORDER — AMMONIA AROMATIC IN INHA
RESPIRATORY_TRACT | Status: AC
  Filled 2016-01-07: qty 10

## 2016-01-07 MED ORDER — NALOXONE HCL 2 MG/2ML IJ SOSY
2.0000 mg | PREFILLED_SYRINGE | Freq: Once | INTRAMUSCULAR | Status: DC
Start: 2016-01-07 — End: 2016-01-07
  Filled 2016-01-07: qty 2

## 2016-01-07 NOTE — Discharge Instructions (Signed)
Community Resource Guide Outpatient Counseling/Substance Abuse Adult °The [employer]’s “211” is a great source of information about community services available.  Access by dialing 2-1-1 from anywhere in [place], or by website -  www.nc211.org.  ° °Other Local Resources (Updated 08/2015) ° °Crisis Hotlines °  °Services  ° °  °Area Served  °[facility] • Crisis Hotline, available 24 hours a day, 7 days a week: 800-939-5911 [place], [place]  ° [facility] • Crisis Hotline, available 24 hours a day, 7 days a week: 866-275-9552 [place], [place]  °[facility] • Suicide Prevention Hotline, available 24 hours a day, 7 days a week: 800-273-8255 [place], [place]  °[facility] ° • Crisis Hotline, available 24 hours a day, 7 days a week: 336-676-6840 [place], [place] °  °[facility] • Crisis Hotline, available 24 hours a day, 7 days a week: 800-256-2452 All °  °[facility] • Crisis Hotline, available 24 hours a day, 7 days a week: 877-626-1772 [place]  ° °Other Local Resources (Updated 08/2015) ° °Outpatient Counseling/ Substance Abuse Programs  °Services  ° °  °Address and Phone Number  °[facility] ° • Options include Individual counseling, group counseling, intensive outpatient program (several hours a day, several days a week) °• Offers depression assessments °• Provides methadone maintenance program 336-333-6860 °[address] °[place], [place] [address] °  °[facility] ° • Offers partial hospitalization/day treatment and DUI/DWI programs °• Accepts Medicare, private insurance 336-299-4655 °[address], [address] °[place], [place] [address]  °[facility] ° ° • Services include intensive outpatient program (several hours a day, several days a week), outpatient treatment, DUI/DWI services, family education °• Also has some services specifically for Veterans °• Offers transitional housing   [phone] °[address] °[place], [place] [address] °  °  °[facility] • Accepts Medicare, private pay, and private insurance 336-272-0855 °[address] °[place], [place] [address]  °[facility] • Services include individual counseling, substance abuse intensive outpatient program (several hours a day, several days a week), day treatment °• Accepts Medicare, Medicaid, private insurance 336-271-5888 °[address] °[place], [place] [address]  °[facility] ° • Offers substance abuse intensive outpatient program (several hours a day, several days a week), partial hospitalization program 336-832-9800 °[address] °[place], [place] [address] ° °336-349-4454 °[address] °[place], [place] [address] ° °336-386-3795 °[address] °[place], [place] [address] ° °[phone] °[address] °[place], [place] [address]  °[facility] • Individual counseling only °• Accepts private insurance only 336-292-1510 °[address] °[place], [place] [address]  °[facility] • Methadone maintenance program 800-805-6989 °[address] °[place], [place] [address]  °[facility] • Walk-In Clinic providing substance abuse and mental health counseling °• Accepts Medicaid, Medicare, private insurance °• Offers sliding scale for uninsured 336-342-8316 °[address] °[place], [place]   °[facility] • Offers individual counseling, and intensive in-home services 336-347-7415 °[address] °[place], [place] [address]  °[facility] • Offers individual counseling, family counseling, group therapy, domestic violence counseling, consumer credit counseling °• Accepts Medicare, Medicaid, private insurance °• Offers sliding scale for uninsured 336-387-6161 °[address] °[place], [place] [address] ° °336-889-6161 °[place], [address] °[place],  [address]  °[facility] • Offers individual, family   and group counseling °• 3 locations - [place], [place], and [place] ° 336-899-8800 ° °[address] °[place], [place] [address] ° °[address] °[place], [place] [address] ° °[address] °[place], [place] [address]  °[facility]  ° • Offers psychiatric assessment, 8-week Intensive Outpatient Program (several hours a day, several times a week, daytime or evenings), early recovery group, family Program, medication management °• Private pay or private insurance only 336 -[phone], or  °800-659-3381 °[address] °[place], [place] [address]  °[facility] • Offers individual, couples and family counseling °• Accepts Medicaid, private insurance, and sliding scale for uninsured 336-542-2076 °[address] °[place], [place] [address]  °David Fuller, MD • Individual counseling °• Private insurance 336-852-4051 °[address] °[place], [place] [address]  °[facility] ° • Offers assessment, substance abuse treatment, and behavioral health treatment [phone] °[address] °[place], [place] [address]  °[facility] • Individual counseling °• Accepts private insurance 336-272-1972 °[address] °[place], [place] [address]  °[facility] • Individual counseling °• Accepts Medicare, private insurance 336-547-1574 °[address] °[place], [place] [address]  °[facility]  ° • Offers intensive outpatient program (several hours a day, several times a week) °• Private pay, private insurance [phone] °[address] °[place], [place]  °[facility] • Individual counseling °• Medicare, private insurance 336-505-9494 °[address] °[place], [place] [address]  °[facility]  ° • Offers intensive outpatient program (several hours a day, several times a week) and partial hospitalization  program 336-794-3550 °[address] °[place], [place] [address]  °Parrish McKinney, MD • Individual counseling 336-282-1251 °[address] °[place], [place] [address]  °[facility] • Offers Christian counseling to individuals, couples, and families °• Accepts Medicare and private insurance; offers sliding scale for uninsured 336-288-1484 °[address] °[place], [place] [address]  °[facility] • Christian counseling 336-542-2060 °[address] °[place], [place] [address]  °[facility] ° • Offers crisis counseling, individual counseling, group therapy, in-home therapy, domestic violence services, day treatment, DWI services, Community Support Team (CST), Assertive Community Treatment Team (ACTT), substance abuse Intensive Outpatient Program (several hours a day, several times a week) °• 2 locations - [place] and [place] 336-229-5905 °[address] °[place], [place] [address] ° °336-694-1777 °[address] °[place], [place] [address]  °[facility]  ° ° • Individual counseling and group therapy °• Accepts private insurance, Medicare, Medicaid 336-379-7146 °[address] °[place], [place]  °[facility] • Offers individual and family counseling °• Offers LGBTQ services °• Accepts private insurance and private pay 336-288-9190 °[address] °[place], [place] [address]  °[facility]  ° • Offers individual counseling, group therapy, and outpatient detox °• Accepts private insurance 336-389-1413 °[address] °[place], [place]  °[facility] • Individual counseling °• Accepts Medicare, private insurance 336-632-3505 °[address] °[place], [place] [address]  °[facility] • Individual counseling °• Accepts Medicare, private insurance 336-570-0104 °[address] °[place],  [address]  °[facility] ° • Offers substance abuse  Intensive Outpatient Program (several hours a day, several times a week) [phone], or °[phone] °[place],   ° °

## 2016-01-07 NOTE — ED Notes (Signed)
Per EMS, police was at pt's house due to girlfriend overdosing. Pt had outstanding warrants and was to go to jail. Pt then stated he had overdosed on heroin also. Pt weill not answer questions, only nods head. Police are here with patient and when medically cleared will be taking to jail

## 2016-01-07 NOTE — ED Provider Notes (Signed)
CSN: [id number]     Arrival date & time 01/07/16  1238 History   First MD Initiated Contact with Patient 01/07/16 1240     Chief Complaint  Patient presents with  . Drug Overdose     (Consider location/radiation/quality/duration/timing/severity/associated sxs/prior Treatment) HPI Comments: The patient is a 30 year old male, he has a history of substance abuse and was found today with his girlfriend, he made the initial call because she was unconscious after using intravenous heroin, while the paramedics were there the patient started to have increased drowsiness, there was some correlation with the timing regarding the Police Department becoming aware that he was there and had warrants for his arrest. However the patient continued to have some drowsiness which seemed to get worse in route. His vital signs were normal, he maintained his ability to breathe, he was not given any medications prehospital, the patient is not awake alert or answering any of my questions.  Patient is a 30 y.o. male presenting with Overdose. The history is provided by the patient.  Drug Overdose    Past Medical History  Diagnosis Date  . Headache(784.0)   . Anxiety   . ADHD (attention deficit hyperactivity disorder)   . Hx MRSA infection 2008    of leg   Past Surgical History  Procedure Laterality Date  . No past surgeries    . Eye surgery    . Lacrimal tube insertion  10/19/2011    Procedure: LACRIMAL TUBE INSERTION;  Surgeon: Susa Simmondsarroll F Haines, MD;  Location: [facility];  Service: Ophthalmology;  Laterality: Right;  Repair of Laceration of Lacrimal System Right Lower Lid; monostent placed into right lacrimal duct by Dr. Lita MainsHaines, 10/19/2011 @ 1009; lot# [id number]   Family History  Problem Relation Age of Onset  . Anesthesia problems Neg Hx   . Hypotension Neg Hx   . Malignant hyperthermia Neg Hx   . Pseudochol deficiency Neg Hx    Social History  Substance Use Topics  . Smoking status: Current Every Day Smoker  -- 0.50 packs/day for 10 years    Types: Cigarettes  . Smokeless tobacco: Never Used  . Alcohol Use: No    Review of Systems  Unable to perform ROS: Mental status change      Allergies  Bee venom; Ibuprofen; and Penicillins  Home Medications   Prior to Admission medications   Medication Sig Start Date End Date Taking? Authorizing Provider  oxyCODONE-acetaminophen (PERCOCET/ROXICET) 5-325 MG tablet Take 1 tablet by mouth every 4 (four) hours as needed. Patient not taking: Reported on 01/07/2016 10/13/15   Tammy Triplett, PA-C   BP 129/94 mmHg  Pulse 71  Temp(Src) 99 F (37.2 C) (Oral)  Resp 11  Ht 6\' 4"  (1.93 m)  Wt 250 lb (113.399 kg)  BMI 30.44 kg/m2  SpO2 99% Physical Exam  Constitutional: He appears well-developed and well-nourished. No distress.  HENT:  Head: Normocephalic and atraumatic.  Mouth/Throat: Oropharynx is clear and moist. No oropharyngeal exudate.  Eyes: Conjunctivae and EOM are normal. Pupils are equal, round, and reactive to light. Right eye exhibits no discharge. Left eye exhibits no discharge. No scleral icterus.  Neck: Normal range of motion. Neck supple. No JVD present. No thyromegaly present.  Cardiovascular: Normal rate, regular rhythm, normal heart sounds and intact distal pulses.  Exam reveals no gallop and no friction rub.   No murmur heard. Pulmonary/Chest: Effort normal and breath sounds normal. No respiratory distress. He has no wheezes. He has no rales.  Abdominal: Soft. Bowel  sounds are normal. He exhibits no distension and no mass. There is no tenderness.  Musculoskeletal: Normal range of motion. He exhibits no edema or tenderness.  Lymphadenopathy:    He has no cervical adenopathy.  Neurological:  Obtunded, minimally arousable to deep painful stimuli, gag is intact  Skin: Skin is warm and dry. No rash noted. No erythema.  Track marks across the bilateral upper extremities  Psychiatric: He has a normal mood and affect. His behavior is  normal.  Nursing note and vitals reviewed.   ED Course  Procedures (including critical care time) Labs Review Labs Reviewed  CBC WITH DIFFERENTIAL/PLATELET - Abnormal; Notable for the following:    RBC 4.18 (*)    All other components within normal limits  COMPREHENSIVE METABOLIC PANEL - Abnormal; Notable for the following:    Potassium 3.4 (*)    Glucose, Bld 128 (*)    Calcium 8.7 (*)    AST 53 (*)    ALT 103 (*)    All other components within normal limits  ETHANOL  URINALYSIS, ROUTINE W REFLEX MICROSCOPIC (NOT AT [facility])    Imaging Review No results found. I have personally reviewed and evaluated these images and lab results as part of my medical decision-making.   EKG Interpretation   Date/Time:  Saturday January 07 2016 12:44:30 EDT Ventricular Rate:  88 PR Interval:  208 QRS Duration: 97 QT Interval:  359 QTC Calculation: 434 R Axis:   81 Text Interpretation:  Sinus rhythm Borderline prolonged PR interval ECG  OTHERWISE WITHIN NORMAL LIMITS No old tracing to compare Confirmed by  Anaiyah Anglemyer  MD, Amaziah Ghosh ([id number]) on 01/07/2016 1:08:49 PM      MDM   Final diagnoses:  Opiate overdose, undetermined intent, initial encounter    The patient's initial presentation suggested there was some sort of secondary gain or malingering behavior to avoid arrest however he does appear to have a decreased mental status and is fairly elusive to deep painful stimuli. At this point despite normal vital signs I will  give Narcan and, watch very closely.  When pt heard the word Narcan, he immediately woke up and refused the medicine - he has been awake for over 2 hours - he is stable for d/c. - there is no signs of opiate OD / toxicity - he is in the custody of the police on [place]   Eber Hong, MD 01/07/16 1514

## 2016-01-07 NOTE — ED Notes (Signed)
ammonia used to arouse pt per MD

## 2017-09-07 ENCOUNTER — Emergency Department (HOSPITAL_COMMUNITY)
Admission: EM | Admit: 2017-09-07 | Discharge: 2017-09-07 | Discharge status: Against Medical Advice | Payer: Self-pay | Attending: Emergency Medicine | Admitting: Emergency Medicine

## 2017-09-07 ENCOUNTER — Encounter (HOSPITAL_COMMUNITY): Payer: Self-pay | Admitting: Emergency Medicine

## 2017-09-07 DIAGNOSIS — Z5329 Procedure and treatment not carried out because of patient's decision for other reasons: Secondary | ICD-10-CM | POA: Insufficient documentation

## 2017-09-07 DIAGNOSIS — R0600 Dyspnea, unspecified: Secondary | ICD-10-CM

## 2017-09-07 DIAGNOSIS — F419 Anxiety disorder, unspecified: Secondary | ICD-10-CM | POA: Insufficient documentation

## 2017-09-07 DIAGNOSIS — F17210 Nicotine dependence, cigarettes, uncomplicated: Secondary | ICD-10-CM | POA: Insufficient documentation

## 2017-09-07 DIAGNOSIS — F909 Attention-deficit hyperactivity disorder, unspecified type: Secondary | ICD-10-CM | POA: Insufficient documentation

## 2017-09-07 NOTE — ED Provider Notes (Signed)
[facility] EMERGENCY DEPARTMENT Provider Note   CSN: [id number] Arrival date & time: 09/07/17  2116     History   Chief Complaint Chief Complaint  Patient presents with  . Panic Attack    HPI Austin Bryant is a 32 y.o. male.  HPI   Austin Bryant is a 32 y.o. male who presents to the Emergency Department complaining of sudden onset of chest pain shortness of breath and feeling anxious.  He states his symptoms began approximately 1-2 hours prior to arrival.  He states that he has been incarcerated and released from jail 2 days ago.  He states earlier today that he was feeling fine and woke up from a nap and suddenly felt anxious and could not breathe well.  He states his family scared him and he called EMS to bring him to the emergency department.  He denies any symptoms at present and states that he now "feels fine" and wants to go home. Denies suicidal or homicidal thoughts or hallucinations.  Reports history of anxiety, but is not currently taking any medications.   Past Medical History:  Diagnosis Date  . ADHD (attention deficit hyperactivity disorder)   . Anxiety   . Headache(784.0)   . Hx MRSA infection 2008   of leg    Patient Active Problem List   Diagnosis Date Noted  . 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 CONTUSION, SHOULDER 05/30/2009    Past Surgical History:  Procedure Laterality Date  . EYE SURGERY    . LACRIMAL TUBE INSERTION  10/19/2011   Procedure: LACRIMAL TUBE INSERTION;  Surgeon: Susa Simmondsarroll F Haines, MD;  Location: [facility];  Service: Ophthalmology;  Laterality: Right;  Repair of Laceration of Lacrimal System Right Lower Lid; monostent placed into right lacrimal duct by Dr. Lita MainsHaines, 10/19/2011 @ 1009; lot# [id number]  . NO PAST SURGERIES         Home Medications    Prior to Admission medications   Medication Sig Start Date End Date Taking? Authorizing Provider  oxyCODONE-acetaminophen (PERCOCET/ROXICET) 5-325 MG tablet Take 1 tablet  by mouth every 4 (four) hours as needed. Patient not taking: Reported on 01/07/2016 10/13/15   Pauline Ausriplett, Pershing Skidmore, PA-C    Family History Family History  Problem Relation Age of Onset  . Anesthesia problems Neg Hx   . Hypotension Neg Hx   . Malignant hyperthermia Neg Hx   . Pseudochol deficiency Neg Hx     Social History Social History   Tobacco Use  . Smoking status: Current Every Day Smoker    Packs/day: 0.50    Years: 10.00    Pack years: 5.00    Types: Cigarettes  . Smokeless tobacco: Never Used  Substance Use Topics  . Alcohol use: No  . Drug use: Yes    Types: IV    Comment: heroin     Allergies   Bee venom; Ibuprofen; and Penicillins   Review of Systems Review of Systems  Constitutional: Negative for fever.  Respiratory: Positive for chest tightness.   Cardiovascular: Positive for chest pain.  Gastrointestinal: Negative for nausea and vomiting.  Neurological: Negative for dizziness, seizures, weakness and headaches.  Psychiatric/Behavioral: Negative for hallucinations and suicidal ideas. The patient is nervous/anxious.      Physical Exam Updated Vital Signs BP (!) 153/73 (BP Location: Left Arm)   Pulse (!) 104   Temp 98 F (36.7 C) (Oral)   Resp (!) 22   Ht 6\' 5"  (1.956 m)   Wt 120.2 kg (  265 lb)   SpO2 100%   BMI 31.42 kg/m   Physical Exam  Constitutional: He is oriented to person, place, and time. He appears well-developed and well-nourished. No distress.  HENT:  Head: Atraumatic.  Eyes: EOM are normal. Pupils are equal, round, and reactive to light.  Cardiovascular: Regular rhythm.  Mild tachycardia without murmur  Pulmonary/Chest: Effort normal and breath sounds normal. No respiratory distress. He exhibits no tenderness.  Musculoskeletal: Normal range of motion.  Neurological: He is alert and oriented to person, place, and time. No sensory deficit.  Skin: Skin is warm. Capillary refill takes less than 2 seconds. No rash noted.  Psychiatric: He  has a normal mood and affect.  Nursing note and vitals reviewed.    ED Treatments / Results  Labs (all labs ordered are listed, but only abnormal results are displayed) Labs Reviewed - No data to display  EKG  EKG Interpretation None       Radiology No results found.  Procedures Procedures (including critical care time)  Medications Ordered in ED Medications - No data to display   Initial Impression / Assessment and Plan / ED Course  I have reviewed the triage vital signs and the nursing notes.  Pertinent labs & imaging results that were available during my care of the patient were reviewed by me and considered in my medical decision making (see chart for details).     Patient reviewed on narcotics database, no recent prescriptions filed.  As I entered the room, patient states that he feels fine and he wants to be discharged.  He refuses further workup.  Patient is talking on the telephone during my initial assessment. No SI or HI.  Patient agrees to sign out AMA, agrees to return if he changes his mind or has worsening symptoms, verbalized understanding and risks.    Final Clinical Impressions(s) / ED Diagnoses   Final diagnoses:  Dyspnea, unspecified type  Anxiety    ED Discharge Orders    None       Rosey Bath 09/07/17 2235    Benjiman Core, MD 09/07/17 2333

## 2017-09-07 NOTE — Discharge Instructions (Signed)
Follow-up with your doctor or return to the ER if your symptoms worsen or you change your mind about being evaluated.

## 2017-09-07 NOTE — ED Notes (Signed)
Pt also cv/o chest tightness that started yesterday.

## 2017-09-07 NOTE — ED Triage Notes (Signed)
EMS calle dout for SOB/anxiety attack.

## 2017-09-20 ENCOUNTER — Emergency Department (HOSPITAL_COMMUNITY)
Admission: EM | Admit: 2017-09-20 | Discharge: 2017-09-20 | Disposition: A | Discharge status: Home / Self Care | Payer: Self-pay | Attending: Emergency Medicine | Admitting: Emergency Medicine

## 2017-09-20 ENCOUNTER — Encounter (HOSPITAL_COMMUNITY): Payer: Self-pay | Admitting: Emergency Medicine

## 2017-09-20 ENCOUNTER — Other Ambulatory Visit: Payer: Self-pay

## 2017-09-20 DIAGNOSIS — A4902 Methicillin resistant Staphylococcus aureus infection, unspecified site: Secondary | ICD-10-CM | POA: Insufficient documentation

## 2017-09-20 DIAGNOSIS — F17210 Nicotine dependence, cigarettes, uncomplicated: Secondary | ICD-10-CM | POA: Insufficient documentation

## 2017-09-20 DIAGNOSIS — K029 Dental caries, unspecified: Secondary | ICD-10-CM | POA: Insufficient documentation

## 2017-09-20 DIAGNOSIS — F1910 Other psychoactive substance abuse, uncomplicated: Secondary | ICD-10-CM | POA: Insufficient documentation

## 2017-09-20 MED ORDER — CLINDAMYCIN HCL 150 MG PO CAPS
300.0000 mg | ORAL_CAPSULE | Freq: Once | ORAL | Status: AC
  Administered 2017-09-20: 300 mg via ORAL
  Filled 2017-09-20: qty 2

## 2017-09-20 MED ORDER — CLINDAMYCIN HCL 300 MG PO CAPS: 300.0000 mg | ORAL_CAPSULE | Freq: Four times a day (QID) | ORAL | 0 refills | Status: DC

## 2017-09-20 NOTE — ED Provider Notes (Signed)
[facility] Provider Note   CSN: [id number] Arrival date & time: 09/20/17  0049     History   Chief Complaint Chief Complaint  Patient presents with  . Rash  . Dental Pain  . URI    HPI Austin Bryant is a 32 y.o. male.  The history is provided by the patient.  Rash   This is a chronic problem. The current episode started more than 1 week ago. The problem has not changed since onset.The problem is associated with nothing. There has been no fever. Affected Location: torso and hands. The pain is moderate. The pain has been constant since onset. Pertinent negatives include no blisters. He has tried nothing for the symptoms. The treatment provided no relief.  Dental Pain   This is a chronic problem. The current episode started more than 1 week ago. The problem occurs hourly. The problem has not changed since onset.The pain is severe. He has tried nothing for the symptoms. The treatment provided no relief.  URI   There has been no fever. Associated symptoms include congestion and rash. Pertinent negatives include no chest pain, no cough and no wheezing. He has tried nothing for the symptoms. The treatment provided no relief.    Past Medical History:  Diagnosis Date  . ADHD (attention deficit hyperactivity disorder)   . Anxiety   . Headache(784.0)   . Hx MRSA infection 2008   of leg    Patient Active Problem List   Diagnosis Date Noted  . 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 CONTUSION, SHOULDER 05/30/2009    Past Surgical History:  Procedure Laterality Date  . EYE SURGERY    . LACRIMAL TUBE INSERTION  10/19/2011   Procedure: LACRIMAL TUBE INSERTION;  Surgeon: Susa Simmondsarroll F Haines, MD;  Location: [facility];  Service: Ophthalmology;  Laterality: Right;  Repair of Laceration of Lacrimal System Right Lower Lid; monostent placed into right lacrimal duct by Dr. Lita MainsHaines, 10/19/2011 @ 1009; lot# [id number]  . NO PAST SURGERIES          Home Medications    Prior to Admission medications   Medication Sig Start Date End Date Taking? Authorizing Provider  clindamycin (CLEOCIN) 300 MG capsule Take 1 capsule (300 mg total) by mouth 4 (four) times daily. X 7 days 09/20/17   Cy BlamerPalumbo, Kyre Jeffries, MD    Family History Family History  Problem Relation Age of Onset  . Anesthesia problems Neg Hx   . Hypotension Neg Hx   . Malignant hyperthermia Neg Hx   . Pseudochol deficiency Neg Hx     Social History Social History   Tobacco Use  . Smoking status: Current Every Day Smoker    Packs/day: 0.50    Years: 10.00    Pack years: 5.00    Types: Cigarettes  . Smokeless tobacco: Never Used  Substance Use Topics  . Alcohol use: No  . 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 Negative for drooling.   Eyes: Negative for photophobia.  Respiratory: Negative for cough, shortness of breath and wheezing.   Cardiovascular: Negative for chest pain.  Skin: Positive for rash.  All other systems reviewed and are negative.    Physical Exam Updated Vital Signs BP (!) 125/95   Pulse (!) 120   Temp  98.8 F (37.1 C)   Resp 20   SpO2 98%   Physical Exam  Constitutional: He is oriented to person, place, and time. He appears well-developed and well-nourished. No distress.  HENT:  Head: Normocephalic and atraumatic.  Mouth/Throat: No oropharyngeal exudate.  Multiple dental caries upper and lower on the left, without trismus, swelling or abscess  Eyes: Conjunctivae and EOM are normal.  Neck: Normal range of motion. Neck supple. No tracheal deviation present.  Cardiovascular: Normal rate, regular rhythm, normal heart sounds and intact distal pulses.  Pulmonary/Chest: Effort normal and breath sounds normal. No stridor. No respiratory distress. He has no wheezes. He has no  rales.  Abdominal: Soft. He exhibits no mass. There is no tenderness. There is no rebound and no guarding.  Musculoskeletal: Normal range of motion.  Lymphadenopathy:    He has no cervical adenopathy.  Neurological: He is alert and oriented to person, place, and time. He displays normal reflexes.  Skin: Skin is warm and dry. Capillary refill takes less than 2 seconds. No ecchymosis, no laceration and no petechiae noted. Rash is not nodular, not pustular and not urticarial. No pallor.  Ingrown hairs of the abdomen with surrounding redness consistent with staph folliculitis   Psychiatric: His affect is angry.  Keeps sniffing and appears high on unknown substances      ED Treatments / Results   Radiology No results found.  Procedures Procedures (including critical care time)  Medications Ordered in ED Medications  clindamycin (CLEOCIN) capsule 300 mg (300 mg Oral Given 09/20/17 0401)       Final Clinical Impressions(s) / ED Diagnoses   Final diagnoses:  MRSA infection  Dental caries  Substance abuse (HCC)   Skin and dental caries will both respond to clindamycin.  I will not be prescribing narcotic pain medication.  Patient is angry because he is under house arrest and has been out of the house too long.  Wellness follow up and dental resources given.     Will place on ABX for PNA seen on cxr of admission as he was not given RX when he AMAed.    Return for weakness, numbness, changes in vision or speech,  fevers > 100.4 unrelieved by medication, shortness of breath, intractable vomiting, or diarrhea, abdominal pain, Inability to tolerate liquids or food, cough, altered mental status or any concerns. No signs of systemic illness or infection. The patient is nontoxic-appearing on exam and vital signs are within normal limits.    I have reviewed the triage vital signs and the nursing notes. Pertinent labs &imaging results that were available during my care of the patient were  reviewed by me and considered in my medical decision making (see chart for details).  After history, exam, and medical workup I feel the patient has been appropriately medically screened and is safe for discharge home. Pertinent diagnoses were discussed with the patient. Patient was given return precautions.  ED Discharge Orders        Ordered    clindamycin (CLEOCIN) 300 MG capsule  4 times daily     09/20/17 0501       Gustave Lindeman, MD 09/20/17 1610

## 2017-09-20 NOTE — ED Triage Notes (Signed)
Patient here with URI, dental pain on the left lower jaw, where she had a tooth break off.  Patient states that he has a rash that just broke out on his body, specifically on his abdomen and pelvis area.

## 2017-09-28 ENCOUNTER — Emergency Department (HOSPITAL_COMMUNITY): Payer: Self-pay

## 2017-09-28 ENCOUNTER — Other Ambulatory Visit: Payer: Self-pay

## 2017-09-28 ENCOUNTER — Emergency Department (HOSPITAL_COMMUNITY)
Admission: EM | Admit: 2017-09-28 | Discharge: 2017-09-28 | Disposition: A | Discharge status: Home / Self Care | Payer: Self-pay | Attending: Emergency Medicine | Admitting: Emergency Medicine

## 2017-09-28 ENCOUNTER — Encounter (HOSPITAL_COMMUNITY): Payer: Self-pay | Admitting: Emergency Medicine

## 2017-09-28 DIAGNOSIS — F17210 Nicotine dependence, cigarettes, uncomplicated: Secondary | ICD-10-CM | POA: Insufficient documentation

## 2017-09-28 DIAGNOSIS — N451 Epididymitis: Secondary | ICD-10-CM | POA: Insufficient documentation

## 2017-09-28 DIAGNOSIS — R1030 Lower abdominal pain, unspecified: Secondary | ICD-10-CM | POA: Insufficient documentation

## 2017-09-28 LAB — BASIC METABOLIC PANEL
ANION GAP: 9 (ref 5–15)
BUN: 12 mg/dL (ref 6–20)
CALCIUM: 8.8 mg/dL — AB (ref 8.9–10.3)
CHLORIDE: 106 mmol/L (ref 101–111)
CO2: 23 mmol/L (ref 22–32)
Creatinine, Ser: 0.84 mg/dL (ref 0.61–1.24)
GFR calc non Af Amer: >60 mL/min (ref >60)
Glucose, Bld: 92 mg/dL (ref 65–99)
Potassium: 3.5 mmol/L (ref 3.5–5.1)
Sodium: 138 mmol/L (ref 135–145)

## 2017-09-28 LAB — CBC WITH DIFFERENTIAL/PLATELET
BASOS ABS: 0 10*3/uL (ref 0.0–0.1)
BASOS PCT: 0 %
EOS ABS: 0.2 10*3/uL (ref 0.0–0.7)
Eosinophils Relative: 2 %
HEMATOCRIT: 43.8 % (ref 39.0–52.0)
HEMOGLOBIN: 14.6 g/dL (ref 13.0–17.0)
Lymphocytes Relative: 42 %
Lymphs Abs: 4 10*3/uL (ref 0.7–4.0)
MCH: 33.1 pg (ref 26.0–34.0)
MCHC: 33.3 g/dL (ref 30.0–36.0)
MCV: 99.3 fL (ref 78.0–100.0)
Monocytes Absolute: 0.7 10*3/uL (ref 0.1–1.0)
Monocytes Relative: 8 %
NEUTROS ABS: 4.6 10*3/uL (ref 1.7–7.7)
Neutrophils Relative %: 48 %
Platelets: 203 10*3/uL (ref 150–400)
RBC: 4.41 MIL/uL (ref 4.22–5.81)
RDW: 13 % (ref 11.5–15.5)
WBC: 9.6 10*3/uL (ref 4.0–10.5)

## 2017-09-28 MED ORDER — DOXYCYCLINE HYCLATE 100 MG PO CAPS: 100.0000 mg | ORAL_CAPSULE | Freq: Two times a day (BID) | ORAL | 0 refills | Status: DC

## 2017-09-28 MED ORDER — ONDANSETRON HCL 4 MG/2ML IJ SOLN
4.0000 mg | Freq: Once | INTRAMUSCULAR | Status: AC
  Administered 2017-09-28: 4 mg via INTRAVENOUS
  Filled 2017-09-28: qty 2

## 2017-09-28 MED ORDER — MORPHINE SULFATE (PF) 4 MG/ML IV SOLN
4.0000 mg | Freq: Once | INTRAVENOUS | Status: AC
  Administered 2017-09-28: 4 mg via INTRAVENOUS
  Filled 2017-09-28: qty 1

## 2017-09-28 MED ORDER — MORPHINE SULFATE 15 MG PO TABS: 15.0000 mg | ORAL_TABLET | ORAL | 0 refills | Status: DC | PRN

## 2017-09-28 MED ORDER — AZITHROMYCIN 1 G PO PACK
2.0000 g | PACK | Freq: Once | ORAL | Status: AC
Start: 2017-09-28 — End: 2017-09-28
  Administered 2017-09-28: 2 g via ORAL
  Filled 2017-09-28: qty 2

## 2017-09-28 MED ORDER — AZITHROMYCIN 250 MG PO TABS
1000.0000 mg | ORAL_TABLET | Freq: Once | ORAL | Status: DC
  Filled 2017-09-28: qty 4

## 2017-09-28 MED ORDER — ACETAMINOPHEN 500 MG PO TABS
1000.0000 mg | ORAL_TABLET | Freq: Once | ORAL | Status: AC
  Administered 2017-09-28: 1000 mg via ORAL
  Filled 2017-09-28: qty 2

## 2017-09-28 MED ORDER — DEXTROSE 5 % IV SOLN: 250.0000 mg | Freq: Once | INTRAVENOUS | Status: DC

## 2017-09-28 NOTE — ED Notes (Signed)
Requested urine from patient. 

## 2017-09-28 NOTE — ED Notes (Signed)
Ultrasound in room
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
## 2017-09-28 NOTE — Discharge Instructions (Signed)
Wear tighty whities.  Apply ice 15 min on and off.  Return for sudden worsening pain.

## 2018-02-11 ENCOUNTER — Encounter (HOSPITAL_COMMUNITY): Payer: Self-pay | Admitting: Emergency Medicine

## 2018-02-11 ENCOUNTER — Emergency Department (HOSPITAL_COMMUNITY): Payer: Self-pay

## 2018-02-11 ENCOUNTER — Emergency Department (HOSPITAL_COMMUNITY)
Admission: EM | Admit: 2018-02-11 | Discharge: 2018-02-12 | Disposition: A | Discharge status: Home / Self Care | Payer: Self-pay | Attending: Emergency Medicine | Admitting: Emergency Medicine

## 2018-02-11 ENCOUNTER — Other Ambulatory Visit: Payer: Self-pay

## 2018-02-11 DIAGNOSIS — Z5321 Procedure and treatment not carried out due to patient leaving prior to being seen by health care provider: Secondary | ICD-10-CM | POA: Insufficient documentation

## 2018-02-11 DIAGNOSIS — R509 Fever, unspecified: Secondary | ICD-10-CM | POA: Insufficient documentation

## 2018-02-11 DIAGNOSIS — Y999 Unspecified external cause status: Secondary | ICD-10-CM | POA: Insufficient documentation

## 2018-02-11 DIAGNOSIS — R21 Rash and other nonspecific skin eruption: Secondary | ICD-10-CM | POA: Insufficient documentation

## 2018-02-11 DIAGNOSIS — Y939 Activity, unspecified: Secondary | ICD-10-CM | POA: Insufficient documentation

## 2018-02-11 DIAGNOSIS — Y929 Unspecified place or not applicable: Secondary | ICD-10-CM | POA: Insufficient documentation

## 2018-02-11 DIAGNOSIS — W228XXA Striking against or struck by other objects, initial encounter: Secondary | ICD-10-CM | POA: Insufficient documentation

## 2018-02-11 DIAGNOSIS — S81811A Laceration without foreign body, right lower leg, initial encounter: Secondary | ICD-10-CM | POA: Insufficient documentation

## 2018-02-11 LAB — COMPREHENSIVE METABOLIC PANEL
ALK PHOS: 71 U/L (ref 38–126)
ALT: 68 U/L — ABNORMAL HIGH (ref 0–44)
ANION GAP: 10 (ref 5–15)
AST: 48 U/L — ABNORMAL HIGH (ref 15–41)
Albumin: 3.4 g/dL — ABNORMAL LOW (ref 3.5–5.0)
BUN: 8 mg/dL (ref 6–20)
CALCIUM: 8.6 mg/dL — AB (ref 8.9–10.3)
CO2: 26 mmol/L (ref 22–32)
Chloride: 103 mmol/L (ref 98–111)
Creatinine, Ser: 0.79 mg/dL (ref 0.61–1.24)
GFR calc Af Amer: >60 mL/min (ref >60)
GFR calc non Af Amer: >60 mL/min (ref >60)
Glucose, Bld: 101 mg/dL — ABNORMAL HIGH (ref 70–99)
Potassium: 2.8 mmol/L — ABNORMAL LOW (ref 3.5–5.1)
Sodium: 139 mmol/L (ref 135–145)
TOTAL PROTEIN: 6.5 g/dL (ref 6.5–8.1)
Total Bilirubin: 0.4 mg/dL (ref 0.3–1.2)

## 2018-02-11 LAB — CBC WITH DIFFERENTIAL/PLATELET
ABS IMMATURE GRANULOCYTES: 0 10*3/uL (ref 0.0–0.1)
BASOS ABS: 0 10*3/uL (ref 0.0–0.1)
BASOS PCT: 0 %
Eosinophils Absolute: 0.2 10*3/uL (ref 0.0–0.7)
Eosinophils Relative: 3 %
HCT: 37.7 % — ABNORMAL LOW (ref 39.0–52.0)
Hemoglobin: 12.5 g/dL — ABNORMAL LOW (ref 13.0–17.0)
IMMATURE GRANULOCYTES: 0 %
Lymphocytes Relative: 37 %
Lymphs Abs: 2.6 10*3/uL (ref 0.7–4.0)
MCH: 32 pg (ref 26.0–34.0)
MCHC: 33.2 g/dL (ref 30.0–36.0)
MCV: 96.4 fL (ref 78.0–100.0)
MONO ABS: 0.6 10*3/uL (ref 0.1–1.0)
Monocytes Relative: 9 %
NEUTROS ABS: 3.5 10*3/uL (ref 1.7–7.7)
NEUTROS PCT: 51 %
PLATELETS: 285 10*3/uL (ref 150–400)
RBC: 3.91 MIL/uL — ABNORMAL LOW (ref 4.22–5.81)
RDW: 13.3 % (ref 11.5–15.5)
WBC: 6.9 10*3/uL (ref 4.0–10.5)

## 2018-02-11 LAB — I-STAT CG4 LACTIC ACID, ED: Lactic Acid, Venous: 1.2 mmol/L (ref 0.5–1.9)
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
## 2018-02-12 ENCOUNTER — Other Ambulatory Visit: Payer: Self-pay

## 2018-02-12 ENCOUNTER — Encounter (HOSPITAL_COMMUNITY): Payer: Self-pay | Admitting: Emergency Medicine

## 2018-02-12 ENCOUNTER — Emergency Department (HOSPITAL_COMMUNITY)
Admission: EM | Admit: 2018-02-12 | Discharge: 2018-02-12 | Disposition: A | Discharge status: Home / Self Care | Payer: Self-pay | Attending: Emergency Medicine | Admitting: Emergency Medicine

## 2018-02-12 DIAGNOSIS — Z5321 Procedure and treatment not carried out due to patient leaving prior to being seen by health care provider: Secondary | ICD-10-CM | POA: Insufficient documentation

## 2018-02-12 DIAGNOSIS — M79604 Pain in right leg: Secondary | ICD-10-CM | POA: Insufficient documentation
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
## 2018-02-12 NOTE — ED Notes (Signed)
Pt called no answer, not seen in the lobby.

## 2018-02-12 NOTE — ED Notes (Signed)
Follow up call made   Suggested that pt follow up w pcp for kcl of 2.8  02/12/18  1154  s Anakin Varkey rn

## 2018-02-12 NOTE — ED Notes (Signed)
Called in waiting room with no answer 

## 2018-02-12 NOTE — ED Notes (Signed)
Pt told registration he had to go meet his parole officer

## 2018-02-26 ENCOUNTER — Other Ambulatory Visit: Payer: Self-pay

## 2018-02-26 ENCOUNTER — Encounter (HOSPITAL_COMMUNITY): Payer: Self-pay | Admitting: *Deleted

## 2018-02-26 ENCOUNTER — Emergency Department (HOSPITAL_COMMUNITY)
Admission: EM | Admit: 2018-02-26 | Discharge: 2018-02-26 | Disposition: A | Discharge status: Home / Self Care | Payer: Self-pay | Attending: Emergency Medicine | Admitting: Emergency Medicine

## 2018-02-26 DIAGNOSIS — F1290 Cannabis use, unspecified, uncomplicated: Secondary | ICD-10-CM | POA: Insufficient documentation

## 2018-02-26 DIAGNOSIS — F17210 Nicotine dependence, cigarettes, uncomplicated: Secondary | ICD-10-CM | POA: Insufficient documentation

## 2018-02-26 DIAGNOSIS — R600 Localized edema: Secondary | ICD-10-CM | POA: Insufficient documentation

## 2018-02-26 MED ORDER — CLINDAMYCIN HCL 300 MG PO CAPS: 300.0000 mg | ORAL_CAPSULE | Freq: Four times a day (QID) | ORAL | 0 refills | Status: DC

## 2018-02-26 NOTE — ED Triage Notes (Signed)
Pt says he was in the hospital for an infection in this leg. Since d/c he has still having joint pain and bilateral leg swelling. Intermittent fevers.

## 2018-02-26 NOTE — ED Notes (Signed)
ED Provider at bedside. 

## 2018-02-26 NOTE — Discharge Instructions (Addendum)
Clindamycin as prescribed.  Follow-up with a primary doctor if symptoms are not improving in the next week.

## 2018-02-26 NOTE — ED Provider Notes (Signed)
[facility]-EMERGENCY DEPT Provider Note   CSN: [id number] Arrival date & time: 02/26/18  0137     History   Chief Complaint Chief Complaint  Patient presents with  . Joint Pain    HPI Austin Bryant is a 32 y.o. male.  Patient is a 32 year old male with past medical history of ADHD, anxiety.  He presents for evaluation of lower extremity and foot pain and swelling.  He also reports generalized joint pain.  This is been ongoing for the past 2 weeks.  He was recently admitted [facility] and underwent extensive work-up.  He had ultrasounds of his legs which ruled out DVT, numerous laboratory studies, however no definite cause was found.  He presents here with ongoing discomfort in his feet and legs and generalized joint pain.  He denies any fevers.  The history is provided by the patient.    Past Medical History:  Diagnosis Date  . ADHD (attention deficit hyperactivity disorder)   . Anxiety   . Headache(784.0)   . Hx MRSA infection 2008   of leg    Patient Active Problem List   Diagnosis Date Noted  . 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 CONTUSION, SHOULDER 05/30/2009    Past Surgical History:  Procedure Laterality Date  . EYE SURGERY    . LACRIMAL TUBE INSERTION  10/19/2011   Procedure: LACRIMAL TUBE INSERTION;  Surgeon: Susa Simmondsarroll F Haines, MD;  Location: [facility];  Service: Ophthalmology;  Laterality: Right;  Repair of Laceration of Lacrimal System Right Lower Lid; monostent placed into right lacrimal duct by Dr. Lita MainsHaines, 10/19/2011 @ 1009; lot# [id number]  . NO PAST SURGERIES          Home Medications    Prior to Admission medications   Medication Sig Start Date End Date Taking? Authorizing Provider  acetaminophen (TYLENOL) 500 MG tablet Take 1,500-2,000 mg by mouth every 6 (six) hours as needed for moderate pain.   Yes [provider]  clindamycin (CLEOCIN) 300 MG capsule Take 1 capsule (300 mg total) by mouth 4  (four) times daily. X 7 days Patient not taking: Reported on 02/26/2018 09/20/17   Palumbo, April, MD  doxycycline (VIBRAMYCIN) 100 MG capsule Take 1 capsule (100 mg total) by mouth 2 (two) times daily. One po bid x 7 days Patient not taking: Reported on 02/26/2018 09/28/17   Melene PlanFloyd, Dan, DO  morphine (MSIR) 15 MG tablet Take 1 tablet (15 mg total) by mouth every 4 (four) hours as needed for severe pain. Patient not taking: Reported on 02/26/2018 09/28/17   Melene PlanFloyd, Dan, DO    Family History Family History  Problem Relation Age of Onset  . Anesthesia problems Neg Hx   . Hypotension Neg Hx   . Malignant hyperthermia Neg Hx   . Pseudochol deficiency Neg Hx     Social History Social History   Tobacco Use  . Smoking status: Current Every Day Smoker    Packs/day: 0.50    Years: 10.00    Pack years: 5.00    Types: Cigarettes  . Smokeless tobacco: Never Used  Substance Use Topics  . Alcohol use: Yes    Comment: socially  . Drug use: Yes    Types: IV, Marijuana    Comment: heroin     Allergies   Bee venom; Ibuprofen; and Penicillins   Review of Systems Review of Systems  All other systems reviewed and are negative.    Physical Exam Updated Vital Signs BP 136/86  Pulse 86   Temp 98.3 F (36.8 C)   Resp 16   SpO2 98%   Physical Exam  Constitutional: He is oriented to person, place, and time. He appears well-developed and well-nourished. No distress.  HENT:  Head: Normocephalic and atraumatic.  Mouth/Throat: Oropharynx is clear and moist.  Neck: Normal range of motion. Neck supple.  Cardiovascular: Normal rate and regular rhythm. Exam reveals no friction rub.  No murmur heard. Pulmonary/Chest: Effort normal and breath sounds normal. No respiratory distress. He has no wheezes. He has no rales.  Abdominal: Soft. Bowel sounds are normal. He exhibits no distension. There is no tenderness.  Musculoskeletal: Normal range of motion. He exhibits no edema.  Neurological: He is  alert and oriented to person, place, and time. Coordination normal.  Skin: Skin is warm and dry. He is not diaphoretic.  Nursing note and vitals reviewed.    ED Treatments / Results  Labs (all labs ordered are listed, but only abnormal results are displayed) Labs Reviewed - No data to display  EKG None  Radiology No results found.  Procedures Procedures (including critical care time)  Medications Ordered in ED Medications - No data to display   Initial Impression / Assessment and Plan / ED Course  I have reviewed the triage vital signs and the nursing notes.  Pertinent labs & imaging results that were available during my care of the patient were reviewed by me and considered in my medical decision making (see chart for details).  Patient presents here with complaints as outlined in the HPI.  I am uncertain as to the exact etiology of his symptoms, however nothing appears emergent.  I reviewed his records from [facility] and he left there AGAINST MEDICAL ADVICE after being confronted about smoking in his hospital room and suspected intravenous drug use by him and his male companion.  I see nothing additional to add.  He was receiving IV antibiotics there and oral antibiotics will be prescribed.  Final Clinical Impressions(s) / ED Diagnoses   Final diagnoses:  None    ED Discharge Orders    None       Geoffery Lyons, MD 02/26/18 [phone]

## 2018-03-07 ENCOUNTER — Emergency Department (HOSPITAL_COMMUNITY): Payer: Self-pay

## 2018-03-07 ENCOUNTER — Emergency Department (HOSPITAL_COMMUNITY)
Admission: EM | Admit: 2018-03-07 | Discharge: 2018-03-07 | Disposition: A | Discharge status: Home / Self Care | Payer: Self-pay | Attending: Emergency Medicine | Admitting: Emergency Medicine

## 2018-03-07 ENCOUNTER — Encounter (HOSPITAL_COMMUNITY): Payer: Self-pay

## 2018-03-07 DIAGNOSIS — L02212 Cutaneous abscess of back [any part, except buttock]: Secondary | ICD-10-CM | POA: Insufficient documentation

## 2018-03-07 DIAGNOSIS — F1210 Cannabis abuse, uncomplicated: Secondary | ICD-10-CM | POA: Insufficient documentation

## 2018-03-07 DIAGNOSIS — R079 Chest pain, unspecified: Secondary | ICD-10-CM | POA: Insufficient documentation

## 2018-03-07 DIAGNOSIS — R0602 Shortness of breath: Secondary | ICD-10-CM | POA: Insufficient documentation

## 2018-03-07 DIAGNOSIS — F17210 Nicotine dependence, cigarettes, uncomplicated: Secondary | ICD-10-CM | POA: Insufficient documentation

## 2018-03-07 DIAGNOSIS — F1920 Other psychoactive substance dependence, uncomplicated: Secondary | ICD-10-CM | POA: Insufficient documentation

## 2018-03-07 MED ORDER — OXYCODONE-ACETAMINOPHEN 5-325 MG PO TABS: 1.0000 | ORAL_TABLET | Freq: Four times a day (QID) | ORAL | 0 refills | Status: DC | PRN

## 2018-03-07 MED ORDER — LIDOCAINE-EPINEPHRINE (PF) 2 %-1:200000 IJ SOLN
20.0000 mL | Freq: Once | INTRAMUSCULAR | Status: AC
  Administered 2018-03-07: 20 mL via INTRADERMAL
  Filled 2018-03-07: qty 20

## 2018-03-07 MED ORDER — OXYCODONE-ACETAMINOPHEN 5-325 MG PO TABS
1.0000 | ORAL_TABLET | Freq: Once | ORAL | Status: AC
  Administered 2018-03-07: 1 via ORAL
  Filled 2018-03-07: qty 1

## 2018-03-07 MED ORDER — DOXYCYCLINE HYCLATE 100 MG PO CAPS: 100.0000 mg | ORAL_CAPSULE | Freq: Two times a day (BID) | ORAL | 0 refills | Status: DC

## 2018-03-07 NOTE — ED Provider Notes (Signed)
[facility]-EMERGENCY DEPT Provider Note   CSN: [id number] Arrival date & time: 03/07/18  2030     History   Chief Complaint Chief Complaint  Patient presents with  . Abscess    HPI Austin Bryant is a 32 y.o. male.  The history is provided by the patient. No language interpreter was used.  Abscess     32 year old male with history of MRSA infection, anxiety, ADHD presenting for evaluation of skin infection.  Patient reports for the past 3 days he has noticed several bumps to the back of his back that is becoming increasingly more painful.  He describes it as a throbbing 10 out of 10 pain worsening when he lays on it and nothing seems to improve much.  He report initially was a small bump that his significant other tries to pop it.  1 of which improves however 2 of the bumps became much more tender and enlarged.  No associated fever but when the pain is intense he endorsed chest pain, and shortness of breath.  He is tried taking Vicodin without relief.  He is up-to-date with tetanus.  He mention been admitted a week ago for edema of his hands and legs and was subsequently prescribed clindamycin.  He did just fill the medication and have been taking a total of 6 doses.  He denies IV drug use.     Past Medical History:  Diagnosis Date  . ADHD (attention deficit hyperactivity disorder)   . Anxiety   . Headache(784.0)   . Hx MRSA infection 2008   of leg    Patient Active Problem List   Diagnosis Date Noted  . 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 CONTUSION, SHOULDER 05/30/2009    Past Surgical History:  Procedure Laterality Date  . EYE SURGERY    . LACRIMAL TUBE INSERTION  10/19/2011   Procedure: LACRIMAL TUBE INSERTION;  Surgeon: Susa Simmonds, MD;  Location: [facility];  Service: Ophthalmology;  Laterality: Right;  Repair of Laceration of Lacrimal System Right Lower Lid; monostent placed into right lacrimal duct by Dr. Lita Mains,  10/19/2011 @ 1009; lot# [id number]  . NO PAST SURGERIES          Home Medications    Prior to Admission medications   Medication Sig Start Date End Date Taking? Authorizing Provider  acetaminophen (TYLENOL) 500 MG tablet Take 1,500-2,000 mg by mouth every 6 (six) hours as needed for moderate pain.    [provider]  clindamycin (CLEOCIN) 300 MG capsule Take 1 capsule (300 mg total) by mouth 4 (four) times daily. X 7 days 02/26/18   Geoffery Lyons, MD  doxycycline (VIBRAMYCIN) 100 MG capsule Take 1 capsule (100 mg total) by mouth 2 (two) times daily. One po bid x 7 days Patient not taking: Reported on 02/26/2018 09/28/17   Melene Plan, DO  morphine (MSIR) 15 MG tablet Take 1 tablet (15 mg total) by mouth every 4 (four) hours as needed for severe pain. Patient not taking: Reported on 02/26/2018 09/28/17   Melene Plan, DO    Family History Family History  Problem Relation Age of Onset  . Anesthesia problems Neg Hx   . Hypotension Neg Hx   . Malignant hyperthermia Neg Hx   . Pseudochol deficiency Neg Hx     Social History Social History   Tobacco Use  . Smoking status: Current Every Day Smoker    Packs/day: 0.50    Years: 10.00    Pack years: 5.00  Types: Cigarettes  . Smokeless tobacco: Never Used  Substance Use Topics  . Alcohol use: Yes    Comment: socially  . Drug use: Yes    Types: IV, Marijuana    Comment: heroin     Allergies   Bee venom; Ibuprofen; and Penicillins   Review of Systems Review of Systems  All other systems reviewed and are negative.    Physical Exam Updated Vital Signs BP 124/79 (BP Location: Right Arm)   Pulse 80   Temp 98.5 F (36.9 C) (Oral)   Resp 20   Ht 6\' 5"  (1.956 m)   Wt 113.4 kg (250 lb)   SpO2 100%   BMI 29.65 kg/m   Physical Exam  Constitutional: He appears well-developed and well-nourished. No distress.  HENT:  Head: Atraumatic.  Eyes: Conjunctivae are normal.  Neck: Neck supple.  Cardiovascular: Normal rate and  regular rhythm.  Pulmonary/Chest: Effort normal and breath sounds normal.  Neurological: He is alert.  Skin: Rash (Back: An area of induration, fluctuance and surrounding skin erythema approximately 3 cm in diameter noted to right upper back with tenderness to palpation.  Similar presentation to right mid back measuring 3 x 5 cm) noted.  Psychiatric: He has a normal mood and affect.  Nursing note and vitals reviewed.    ED Treatments / Results  Labs (all labs ordered are listed, but only abnormal results are displayed) Labs Reviewed - No data to display  EKG EKG Interpretation  Date/Time:  Friday March 07 2018 20:50:00 EDT Ventricular Rate:  85 PR Interval:    QRS Duration: 90 QT Interval:  343 QTC Calculation: 408 R Axis:   84 Text Interpretation:  Sinus rhythm Low voltage, precordial leads RSR' in V1 or V2, right VCD or RVH ST elev, probable normal early repol pattern No significant change since last tracing Confirmed by Linwood Dibbles [phone]) on 03/07/2018 8:54:18 PM   Radiology Dg Chest 2 View  Result Date: 03/07/2018 CLINICAL DATA:  Back abscess. EXAM: CHEST - 2 VIEW COMPARISON:  07/29/2012 FINDINGS: The heart size and mediastinal contours are within normal limits. Both lungs are clear. The visualized skeletal structures are unremarkable. IMPRESSION: No active cardiopulmonary disease. Electronically Signed   By: Kennith Center M.D.   On: 03/07/2018 20:49    Procedures .Marland KitchenIncision and Drainage Date/Time: 03/07/2018 11:09 PM Performed by: Fayrene Helper, PA-C Authorized by: Fayrene Helper, PA-C   Consent:    Consent obtained:  Verbal   Consent given by:  Patient   Risks discussed:  Bleeding, incomplete drainage, pain and damage to other organs   Alternatives discussed:  No treatment Universal protocol:    Procedure explained and questions answered to patient or proxy's satisfaction: yes     Relevant documents present and verified: yes     Test results available and properly labeled:  yes     Imaging studies available: yes     Required blood products, implants, devices, and special equipment available: yes     Site/side marked: yes     Immediately prior to procedure a time out was called: yes     Patient identity confirmed:  Verbally with patient Location:    Type:  Abscess   Size:  3cm   Location:  Trunk   Trunk location:  Back (R upper back) Pre-procedure details:    Skin preparation:  Betadine Anesthesia (see MAR for exact dosages):    Anesthesia method:  Local infiltration   Local anesthetic:  Lidocaine 2% WITH epi Procedure  type:    Complexity:  Complex Procedure details:    Incision types:  Single straight   Incision depth:  Subcutaneous   Scalpel blade:  11   Wound management:  Probed and deloculated, irrigated with saline and extensive cleaning   Drainage:  Purulent and bloody   Drainage amount:  Scant   Wound treatment:  Wound left open   Packing materials:  1/4 in iodoform gauze   Amount 1/4" iodoform:  3 Post-procedure details:    Patient tolerance of procedure:  Tolerated with difficulty .Marland Kitchen.Incision and Drainage Date/Time: 03/07/2018 11:11 PM Performed by: Fayrene Helperran, Aerith Canal, PA-C Authorized by: Fayrene Helperran, Venus Ruhe, PA-C   Consent:    Consent obtained:  Verbal   Consent given by:  Patient   Risks discussed:  Bleeding, incomplete drainage, pain and damage to other organs   Alternatives discussed:  No treatment Universal protocol:    Procedure explained and questions answered to patient or proxy's satisfaction: yes     Relevant documents present and verified: yes     Test results available and properly labeled: yes     Imaging studies available: yes     Required blood products, implants, devices, and special equipment available: yes     Site/side marked: yes     Immediately prior to procedure a time out was called: yes     Patient identity confirmed:  Verbally with patient Location:    Type:  Abscess   Location:  Trunk   Trunk location:  Back (R lateral  midback) Pre-procedure details:    Skin preparation:  Betadine Anesthesia (see MAR for exact dosages):    Anesthesia method:  Local infiltration   Local anesthetic:  Lidocaine 2% WITH epi Procedure type:    Complexity:  Complex Procedure details:    Incision types:  Single straight   Incision depth:  Subcutaneous   Scalpel blade:  11   Wound management:  Probed and deloculated, irrigated with saline and extensive cleaning   Drainage:  Purulent   Drainage amount:  Scant   Packing materials:  1/4 in iodoform gauze   Amount 1/4" iodoform:  2 Post-procedure details:    Patient tolerance of procedure:  Tolerated with difficulty   (including critical care time)  Medications Ordered in ED Medications  lidocaine-EPINEPHrine (XYLOCAINE W/EPI) 2 %-1:200000 (PF) injection 20 mL (20 mLs Intradermal Given 03/07/18 2155)  oxyCODONE-acetaminophen (PERCOCET/ROXICET) 5-325 MG per tablet 1 tablet (1 tablet Oral Given 03/07/18 2204)     Initial Impression / Assessment and Plan / ED Course  I have reviewed the triage vital signs and the nursing notes.  Pertinent labs & imaging results that were available during my care of the patient were reviewed by me and considered in my medical decision making (see chart for details).     BP 124/79 (BP Location: Right Arm)   Pulse 80   Temp 98.5 F (36.9 C) (Oral)   Resp 20   Ht 6\' 5"  (1.956 m)   Wt 113.4 kg (250 lb)   SpO2 100%   BMI 29.65 kg/m    Final Clinical Impressions(s) / ED Diagnoses   Final diagnoses:  Cutaneous abscess of back (any part, except buttock)    ED Discharge Orders        Ordered    doxycycline (VIBRAMYCIN) 100 MG capsule  2 times daily     03/07/18 2316    oxyCODONE-acetaminophen (PERCOCET) 5-325 MG tablet  Every 6 hours PRN     03/07/18 2316  9:55 PM Patient presents with several abscess with surrounding cellulitis to his back which will benefit from incision and drainage.  He is up-to-date with tetanus.  11:12  PM Both subcutaneous abscess was incised and drained with scant amount of drainage.  Patient did not tolerate well due to pain and discomfort.  He does not want to proceed further.  Packing was placed.  Recommend warm compress and continue with clindamycin however patient would need to return in 48 hours for wound recheck, packing removal and reassessment.  Patient discharged home with additional pain medication. In order to decrease risk of narcotic abuse. Pt's record were checked using the [place] Controlled Substance database.     Fayrene Helper, PA-C 03/07/18 2318    Maia Plan, MD 03/08/18 [phone]

## 2018-03-07 NOTE — ED Triage Notes (Addendum)
Pt has two reddened, swollen abscess-like areas on back- right lower region and mid upper. Pt reports nothing makes it better. Draining yellow-green pus from both areas per pt. Pt also reports bilateral had and feet swelling. Still taking abx for infection of right leg. Pt also c/o right sided chest pain.

## 2018-03-07 NOTE — Discharge Instructions (Addendum)
Continue to take clindamycin as previously prescribed.  You may switch to doxycycline if no improvement or return to the ER for further care.

## 2018-03-07 NOTE — ED Notes (Signed)
Attempted to draw labs x 2. Unable. Pt states he is very hard and always gets stuck multiple times

## 2018-03-09 ENCOUNTER — Encounter (HOSPITAL_COMMUNITY): Payer: Self-pay | Admitting: *Deleted

## 2018-03-09 ENCOUNTER — Other Ambulatory Visit: Payer: Self-pay

## 2018-03-09 ENCOUNTER — Emergency Department (HOSPITAL_COMMUNITY)
Admission: EM | Admit: 2018-03-09 | Discharge: 2018-03-09 | Disposition: A | Discharge status: Home / Self Care | Payer: Self-pay | Attending: Emergency Medicine | Admitting: Emergency Medicine

## 2018-03-09 DIAGNOSIS — Z5189 Encounter for other specified aftercare: Secondary | ICD-10-CM | POA: Insufficient documentation

## 2018-03-09 DIAGNOSIS — R509 Fever, unspecified: Secondary | ICD-10-CM | POA: Insufficient documentation

## 2018-03-09 DIAGNOSIS — F17210 Nicotine dependence, cigarettes, uncomplicated: Secondary | ICD-10-CM | POA: Insufficient documentation

## 2018-03-09 DIAGNOSIS — Z09 Encounter for follow-up examination after completed treatment for conditions other than malignant neoplasm: Secondary | ICD-10-CM | POA: Insufficient documentation

## 2018-03-09 DIAGNOSIS — L03312 Cellulitis of back [any part except buttock]: Secondary | ICD-10-CM | POA: Insufficient documentation

## 2018-03-09 DIAGNOSIS — F1920 Other psychoactive substance dependence, uncomplicated: Secondary | ICD-10-CM | POA: Insufficient documentation

## 2018-03-09 DIAGNOSIS — R2230 Localized swelling, mass and lump, unspecified upper limb: Secondary | ICD-10-CM | POA: Insufficient documentation

## 2018-03-09 DIAGNOSIS — F1210 Cannabis abuse, uncomplicated: Secondary | ICD-10-CM | POA: Insufficient documentation

## 2018-03-09 MED ORDER — MORPHINE SULFATE (PF) 4 MG/ML IV SOLN
4.0000 mg | Freq: Once | INTRAVENOUS | Status: DC
  Filled 2018-03-09: qty 1

## 2018-03-09 MED ORDER — SODIUM CHLORIDE 0.9 % IV BOLUS: 1000.0000 mL | Freq: Once | INTRAVENOUS | Status: DC

## 2018-03-09 NOTE — Discharge Instructions (Addendum)
Keep wounds clean and dry. Apply warm compresses to affected area throughout the day. START TAKING DOXYCYCLINE and take it until it is finished. IT'S VERY IMPORTANT THAT YOU TAKE THIS ANTIBIOTIC EXACTLY AS IT WAS PRESCRIBED. Alternate between tylenol and motrin as needed for pain and fever [facility] for wound recheck. Monitor area for signs of infection to include, but not limited to: increasing pain, spreading redness, drainage/pus, worsening swelling, or fevers. Return to emergency department for emergent changing or worsening symptoms.

## 2018-03-09 NOTE — ED Notes (Signed)
Upon assessment abscesses noted to be worse, pt to go to the main ED for treatment.

## 2018-03-09 NOTE — ED Triage Notes (Signed)
Follow up to have packing changed/removed from two days ago to back

## 2018-03-09 NOTE — ED Notes (Signed)
Pt declined d/c paperwork

## 2018-03-09 NOTE — ED Provider Notes (Signed)
MSE was initiated and I personally evaluated the patient and placed orders (if any) at  4:35 PM on March 09, 2018.  The patient appears stable so that the remainder of the MSE may be completed by another provider.  Patient presents today for a wound check and packing change/removal.  2 days ago he had I&D performed of 2 abscesses on his back, he reports compliance with his antibiotics.  He reports that after the first day he felt better, however last night he began getting subjective fevers, chills, worsening pain, and generally not feeling well.  Dressings were removed, they were to approximately 5 cm strips of packing material that were outside of both wounds on the dressings.  The uppermost wound measures approximately 7 cm x 7 cm with surrounding erythema, fluctuance, induration, and generally tender to palpation with obvious pus from the wound.  Small amount of this pus was expressed for a wound culture.  Lower abscess measures approximately 10 cm x 2 cm, the wound is open without purulent drainage, however there is a significant area of induration and surrounding tenderness to palpation.  Given the patient reports compliance with his antibiotics, abscess appears to have closed, and he reports systemic symptoms I feel that this patient would be more appropriately evaluated in the main emergency room rather than fast track.  His wounds were covered with gauze, he will be moved to the back when room becomes available.  Orders placed for labs.           Cristina GongHammond, [place] Monds W, PA-C 03/09/18 1639    Shaune PollackIsaacs, [place], MD 03/11/18 [phone]

## 2018-03-09 NOTE — ED Notes (Signed)
Pt ambulated out of ED with a steady gait and in no distress.

## 2018-03-09 NOTE — ED Provider Notes (Signed)
[facility]-EMERGENCY DEPT Provider Note   CSN: [id number] Arrival date & time: 03/09/18  1412     History   Chief Complaint Chief Complaint  Patient presents with  . Wound Check    HPI Austin Bryant is a 32 y.o. male with a PMHx of MRSA, who presents to the ED for wound check.  Pt states that he had 2 pimples on his back that showed up on 03/04/18, his girlfriend popped them on 03/06/18 and they started draining.  He says they were getting worse so he came to the ED on 03/07/18.  Chart review reveals that he was seen in the ED on 03/07/18 for the 2 abscesses on his back; these areas were I&D'd, he was given an rx for doxycycline but he states he was told to hold off on starting it because he reported to the provider that he was already on a course of clindamycin from a prior recent admission at [facility]; he reported at that time that he had only taken 6 doses.  Further chart review reveals that he came to [facility] on 02/11/18 and 02/12/18 for R leg abrasion/infection however left without being seen; he was then seen at [facility] on 02/15/18 for R calf abrasion that had developed into cellulitis, he was admitted and started on vancomycin and began to improve, but left AMA the next day because his girlfriend brought in ?IV drugs and was escorted out and he refused to stay in the hospital without her; he reported to the [facility] providers that he was discharged on doxycycline at some point, but per chart review no antibiotics could be found as being prescribed recently; it doesn't appear he was prescribed anything on discharge from [place], either.  He was then seen in the [facility] on 02/26/18 for c/o extremity swelling (which was what he was admitted at [facility] for, and had an extensive work up including negative DVT studies), and he was given an rx for clindamycin (300mg  4x/day x7 days) since he wasn't given abx on discharge from the hospital given that he had left AMA; he states he  didn't start them for "a few days" (says he "maybe filled them ~02/28/18") however he says he's still on it and has "like a days worth" left.  He has not yet filled the doxycycline rx given to him on 03/07/18.    He states that since leaving on 03/07/2018, he has had subjective fever and chills, hand swelling, ongoing drainage and erythema to the abscesses, and pain around the abscesses.  He describes the pain as 10/10 constant throbbing pain in his back around the abscessed areas, radiating into the right lateral chest/rib cage area, worsening with movement, and improved with Percocet that was given to him on 03/07/2018.  He has not taken anything else for his pain, including not having had any Tylenol or ibuprofen.  He denies any warmth to the areas or red streaking.  He denies using IV drugs.  He also denies CP, SOB, abd pain, N/V/D/C, hematuria, dysuria, numbness, tingling, focal weakness, or any other complaints at this time.   The history is provided by the patient and medical records. No language interpreter was used.  Wound Check  Pertinent negatives include no chest pain, no abdominal pain and no shortness of breath.    Past Medical History:  Diagnosis Date  . ADHD (attention deficit hyperactivity disorder)   . Anxiety   . Headache(784.0)   . Hx MRSA infection 2008  of leg    Patient Active Problem List   Diagnosis Date Noted  . 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 CONTUSION, SHOULDER 05/30/2009    Past Surgical History:  Procedure Laterality Date  . EYE SURGERY    . LACRIMAL TUBE INSERTION  10/19/2011   Procedure: LACRIMAL TUBE INSERTION;  Surgeon: Susa Simmondsarroll F Haines, MD;  Location: [facility];  Service: Ophthalmology;  Laterality: Right;  Repair of Laceration of Lacrimal System Right Lower Lid; monostent placed into right lacrimal duct by Dr. Lita MainsHaines, 10/19/2011 @ 1009; lot# [id number]  . NO PAST SURGERIES          Home Medications    Prior to Admission medications     Medication Sig Start Date End Date Taking? Authorizing Provider  clindamycin (CLEOCIN) 300 MG capsule Take 1 capsule (300 mg total) by mouth 4 (four) times daily. X 7 days 02/26/18   Geoffery Lyonselo, Douglas, MD  doxycycline (VIBRAMYCIN) 100 MG capsule Take 1 capsule (100 mg total) by mouth 2 (two) times daily. One po bid x 7 days 03/07/18   Fayrene Helperran, Bowie, PA-C  morphine (MSIR) 15 MG tablet Take 1 tablet (15 mg total) by mouth every 4 (four) hours as needed for severe pain. Patient not taking: Reported on 02/26/2018 09/28/17   Melene PlanFloyd, Dan, DO  oxyCODONE-acetaminophen (PERCOCET) 5-325 MG tablet Take 1 tablet by mouth every 6 (six) hours as needed for moderate pain or severe pain. 03/07/18   Fayrene Helperran, Bowie, PA-C    Family History Family History  Problem Relation Age of Onset  . Anesthesia problems Neg Hx   . Hypotension Neg Hx   . Malignant hyperthermia Neg Hx   . Pseudochol deficiency Neg Hx     Social History Social History   Tobacco Use  . Smoking status: Current Every Day Smoker    Packs/day: 0.50    Years: 10.00    Pack years: 5.00    Types: Cigarettes  . Smokeless tobacco: Never Used  Substance Use Topics  . Alcohol use: Yes    Comment: socially  . Drug use: Yes    Types: IV, Marijuana    Comment: heroin     Allergies   Bee venom; Ibuprofen; and Penicillins   Review of Systems Review of Systems  Constitutional: Positive for chills and fever (subjective).  Respiratory: Negative for shortness of breath.   Cardiovascular: Negative for chest pain.  Gastrointestinal: Negative for abdominal pain, constipation, diarrhea, nausea and vomiting.  Genitourinary: Negative for dysuria and hematuria.  Musculoskeletal: Positive for back pain. Negative for arthralgias.  Skin: Positive for color change and wound.  Allergic/Immunologic: Negative for immunocompromised state.  Neurological: Negative for weakness and numbness.  Psychiatric/Behavioral: Negative for confusion.   All other systems reviewed  and are negative for acute change except as noted in the HPI.    Physical Exam Updated Vital Signs BP (!) 143/85 (BP Location: Left Arm)   Pulse 92   Temp 98.8 F (37.1 C) (Oral)   Resp 18   Ht 6\' 5"  (1.956 m)   Wt 113.4 kg (250 lb)   SpO2 99%   BMI 29.65 kg/m   Physical Exam  Constitutional: He is oriented to person, place, and time. Vital signs are normal. He appears well-developed and well-nourished.  Non-toxic appearance. No distress.  Afebrile, nontoxic, NAD  HENT:  Head: Normocephalic and atraumatic.  Mouth/Throat: Oropharynx is clear and moist and mucous membranes are normal.  Eyes: Conjunctivae and EOM are normal. Right eye exhibits no discharge. Left  eye exhibits no discharge.  Pinpoint pupils bilaterally  Neck: Normal range of motion. Neck supple.  Cardiovascular: Normal rate, regular rhythm, normal heart sounds and intact distal pulses. Exam reveals no gallop and no friction rub.  No murmur heard. Pulmonary/Chest: Effort normal and breath sounds normal. No respiratory distress. He has no decreased breath sounds. He has no wheezes. He has no rhonchi. He has no rales.  Abdominal: Soft. Normal appearance and bowel sounds are normal. He exhibits no distension. There is no tenderness. There is no rigidity, no rebound, no guarding, no CVA tenderness, no tenderness at McBurney's point and negative Murphy's sign.  Musculoskeletal: Normal range of motion.  No hand swelling appreciated MAE x4 Strength and sensation grossly intact in all extremities Distal pulses intact Gait steady  Neurological: He is alert and oriented to person, place, and time. He has normal strength. No sensory deficit.  Skin: Skin is warm, dry and intact. No rash noted. There is erythema.  Upper back has an ~5 cm circular indurated abscess with central opening and scant amount of purulent material easily expressible from this area, no fluctuance appreciated, minimally erythematous and warm to touch R lower  back has an oblong shaped ~7cm long indurated abscess also with central opening but no purulent material noted, no fluctuance, mild erythema and warmth. No red streaking from either location. Both areas are TTP. SEE PICTURES BELOW  Psychiatric: He has a normal mood and affect.  Nursing note and vitals reviewed.  UPPER BACK:   LOWER BACK:      ED Treatments / Results  Labs (all labs ordered are listed, but only abnormal results are displayed) Labs Reviewed  AEROBIC CULTURE (SUPERFICIAL SPECIMEN)    EKG None  Radiology Dg Chest 2 View  Result Date: 03/07/2018 CLINICAL DATA:  Back abscess. EXAM: CHEST - 2 VIEW COMPARISON:  07/29/2012 FINDINGS: The heart size and mediastinal contours are within normal limits. Both lungs are clear. The visualized skeletal structures are unremarkable. IMPRESSION: No active cardiopulmonary disease. Electronically Signed   By: Kennith Center M.D.   On: 03/07/2018 20:49    Procedures Procedures (including critical care time)  Medications Ordered in ED Medications - No data to display   Initial Impression / Assessment and Plan / ED Course  I have reviewed the triage vital signs and the nursing notes.  Pertinent labs & imaging results that were available during my care of the patient were reviewed by me and considered in my medical decision making (see chart for details).     32 y.o. male here for wound check. States he had two pimples on his back that began 03/04/18, girlfriend popped them 03/06/18 but they worsened so he came in to ED on 03/07/18; I&D was performed, he was given an rx for doxycycline, but he states he was told to hold off on taking that until he finished his clindamycin (which was rx'd 02/26/18, he says he filled it a few days later but told the provider on 03/07/18 that he had only taken 6 doses, however today he says he still has about a day left). He says he has chills and subjective fevers but on arrival he is afebrile (checked twice).  On exam, upper back has a ~5 cm circular indurated area with central opening and scant amount of purulent material easily expressible from this area, no fluctuance appreciated, minimally erythematous and warm to touch; R lower back has an oblong shaped ~7cm long indurated area also with central opening but no  purulent material noted, no fluctuance, mild erythema and warmth. No red streaking from either location. Pinpoint pupils noted. Pt well appearing, the abscesses appear to be doing well and draining appropriately, he really just needs to be compliant with his antibiotics (he says he is, but I highly doubt he has been since his story is very inconsistent with regards to when he started taking it and how often he's taking it, etc). He says he has the doxy rx at the pharmacy, advised stopping the clinda and just taking the doxy starting today. Advised warm compresses, tylenol/motrin for pain, and f/up with UCC/PCP in 2 days for wound check. Doubt need for further labs, imaging, or work up at this time. Doubt need for repeat I&D. I explained the diagnosis and have given explicit precautions to return to the ER including for any other new or worsening symptoms. The patient understands and accepts the medical plan as it's been dictated and I have answered their questions. Discharge instructions concerning home care and prescriptions have been given. The patient is STABLE and is discharged to home in good condition.   Interestingly, [place] database reviewed and 2 year search was notable for only morphine sulfate 15mg  tabs #5 on 09/28/17, despite the fact that he was discharged home with an rx for oxycodone on his ED visit from 03/07/18. No narcotics were given upon discharge today.    Final Clinical Impressions(s) / ED Diagnoses   Final diagnoses:  Visit for wound check  Encounter for recheck of abscess following incision and drainage  Cellulitis of back except buttock    ED Discharge Orders    [address], [place], [place] 03/09/18 1746    Shaune Pollack, MD 03/09/18 [phone]

## 2018-03-12 LAB — AEROBIC CULTURE  (SUPERFICIAL SPECIMEN): SPECIAL REQUESTS: NORMAL

## 2018-03-12 LAB — AEROBIC CULTURE W GRAM STAIN (SUPERFICIAL SPECIMEN)

## 2018-03-13 ENCOUNTER — Telehealth: Payer: Self-pay | Admitting: *Deleted

## 2018-03-13 NOTE — Progress Notes (Signed)
ED Antimicrobial Stewardship Positive Culture Follow Up   Austin LukeDonnie R Bryant is an 32 y.o. male who [facility] to [facility]2018 with a chief complaint of needing a wound check for abscesses on his back. Patient had taken 6 doses of clindamycin upon presentation and during this visit, was instructed to stop clindamycin and start doxycycline. Chief Complaint  Patient presents with  . Wound Check    Recent Results (from the past 720 hour(s))  Wound or Superficial Culture     Status: None   Collection Time: 03/09/18  4:35 PM  Result Value Ref Range Status   Specimen Description   Final    ABSCESS Performed at [facility], [address] [place], [place] [address]    Special Requests   Final    Normal Performed at [facility], [address] [place], [place] [address]    Gram Stain   Final    RARE WBC PRESENT, PREDOMINANTLY PMN FEW GRAM POSITIVE COCCI IN CLUSTERS Performed at [facility], [address] [place], [place] [address]    Culture   Final    ABUNDANT METHICILLIN RESISTANT STAPHYLOCOCCUS AUREUS   Report Status 03/12/2018 FINAL  Final   Organism ID, Bacteria METHICILLIN RESISTANT STAPHYLOCOCCUS AUREUS  Final      Susceptibility   Methicillin resistant staphylococcus aureus - MIC*    CIPROFLOXACIN >=8 RESISTANT Resistant     ERYTHROMYCIN >=8 RESISTANT Resistant     GENTAMICIN <=0.5 SENSITIVE Sensitive     OXACILLIN >=4 RESISTANT Resistant     TETRACYCLINE <=1 SENSITIVE Sensitive     VANCOMYCIN 1 SENSITIVE Sensitive     TRIMETH/SULFA 160 RESISTANT Resistant     CLINDAMYCIN >=8 RESISTANT Resistant     RIFAMPIN <=0.5 SENSITIVE Sensitive     Inducible Clindamycin NEGATIVE Sensitive     * ABUNDANT METHICILLIN RESISTANT STAPHYLOCOCCUS AUREUS    Plan: Call patient to ensure he has been taking doxycyline. If he has, instruct patient to continue with course. If he has not, instruct patient to start doxycycline course as  soon as possible.   ED Provider: Dagoberto LigasGabrielle Mortis, PA-C   Arvilla MarketMelissa Lore, PharmD PGY1 Pharmacy Resident Phone [phone] 03/13/2018     8:51 AM

## 2018-03-13 NOTE — Telephone Encounter (Signed)
Post ED Visit - Positive Culture Follow-up  Culture report reviewed by antimicrobial stewardship pharmacist:  []  Austin Bryant, Pharm.D. []  Austin Bryant, Pharm.D., BCPS AQ-ID []  Austin Bryant, Pharm.D., BCPS []  Austin Bryant, Pharm.D., BCPS []  Austin Bryant, [address] BCPS, AAHIVP []  Austin Bryant, Pharm.D., BCPS, AAHIVP []  Austin Bryant, PharmD, BCPS []  Austin Bryant, PharmD, BCPS []  Austin Bryant, PharmD, BCPS []  Austin Bryant, PharmD Austin Bryant, PharmD  Positive wound culture   Treated with Doxycycline, organism sensitive to the same and no further patient follow-up is required at this time.  Patient states he did get rx for doxycycline filled and is currently taking the medication as prescribed. Austin Bryant, [facility] 03/13/2018, 10:53 AM

## 2018-06-17 ENCOUNTER — Encounter (HOSPITAL_COMMUNITY): Payer: Self-pay | Admitting: Emergency Medicine

## 2018-06-17 ENCOUNTER — Emergency Department (HOSPITAL_COMMUNITY)
Admission: EM | Admit: 2018-06-17 | Discharge: 2018-06-17 | Disposition: A | Discharge status: Home / Self Care | Payer: Self-pay | Attending: Emergency Medicine | Admitting: Emergency Medicine

## 2018-06-17 ENCOUNTER — Emergency Department (HOSPITAL_COMMUNITY): Payer: Self-pay

## 2018-06-17 ENCOUNTER — Other Ambulatory Visit: Payer: Self-pay

## 2018-06-17 DIAGNOSIS — F17210 Nicotine dependence, cigarettes, uncomplicated: Secondary | ICD-10-CM | POA: Insufficient documentation

## 2018-06-17 DIAGNOSIS — Y929 Unspecified place or not applicable: Secondary | ICD-10-CM | POA: Insufficient documentation

## 2018-06-17 DIAGNOSIS — R109 Unspecified abdominal pain: Secondary | ICD-10-CM | POA: Insufficient documentation

## 2018-06-17 DIAGNOSIS — S21111A Laceration without foreign body of right front wall of thorax without penetration into thoracic cavity, initial encounter: Secondary | ICD-10-CM | POA: Insufficient documentation

## 2018-06-17 DIAGNOSIS — Z23 Encounter for immunization: Secondary | ICD-10-CM | POA: Insufficient documentation

## 2018-06-17 DIAGNOSIS — F419 Anxiety disorder, unspecified: Secondary | ICD-10-CM | POA: Insufficient documentation

## 2018-06-17 DIAGNOSIS — Y9389 Activity, other specified: Secondary | ICD-10-CM | POA: Insufficient documentation

## 2018-06-17 DIAGNOSIS — F909 Attention-deficit hyperactivity disorder, unspecified type: Secondary | ICD-10-CM | POA: Insufficient documentation

## 2018-06-17 DIAGNOSIS — Y998 Other external cause status: Secondary | ICD-10-CM | POA: Insufficient documentation

## 2018-06-17 LAB — CBC WITH DIFFERENTIAL/PLATELET
ABS IMMATURE GRANULOCYTES: 0.06 10*3/uL (ref 0.00–0.07)
Basophils Absolute: 0 10*3/uL (ref 0.0–0.1)
Basophils Relative: 0 %
Eosinophils Absolute: 0 10*3/uL (ref 0.0–0.5)
Eosinophils Relative: 0 %
HEMATOCRIT: 44.2 % (ref 39.0–52.0)
HEMOGLOBIN: 14.8 g/dL (ref 13.0–17.0)
IMMATURE GRANULOCYTES: 0 %
LYMPHS ABS: 2.3 10*3/uL (ref 0.7–4.0)
LYMPHS PCT: 16 %
MCH: 31.6 pg (ref 26.0–34.0)
MCHC: 33.5 g/dL (ref 30.0–36.0)
MCV: 94.4 fL (ref 80.0–100.0)
Monocytes Absolute: 0.7 10*3/uL (ref 0.1–1.0)
Monocytes Relative: 5 %
NEUTROS ABS: 11.4 10*3/uL — AB (ref 1.7–7.7)
NEUTROS PCT: 79 %
NRBC: 0 % (ref 0.0–0.2)
Platelets: 202 10*3/uL (ref 150–400)
RBC: 4.68 MIL/uL (ref 4.22–5.81)
RDW: 12.8 % (ref 11.5–15.5)
WBC: 14.5 10*3/uL — ABNORMAL HIGH (ref 4.0–10.5)

## 2018-06-17 LAB — I-STAT CHEM 8, ED
BUN: 23 mg/dL — AB (ref 6–20)
CREATININE: 1 mg/dL (ref 0.61–1.24)
Calcium, Ion: 1.15 mmol/L (ref 1.15–1.40)
Chloride: 105 mmol/L (ref 98–111)
Glucose, Bld: 107 mg/dL — ABNORMAL HIGH (ref 70–99)
HEMATOCRIT: 45 % (ref 39.0–52.0)
Hemoglobin: 15.3 g/dL (ref 13.0–17.0)
POTASSIUM: 4.5 mmol/L (ref 3.5–5.1)
SODIUM: 139 mmol/L (ref 135–145)
TCO2: 24 mmol/L (ref 22–32)

## 2018-06-17 LAB — SAMPLE TO BLOOD BANK

## 2018-06-17 MED ORDER — TETANUS-DIPHTH-ACELL PERTUSSIS 5-2.5-18.5 LF-MCG/0.5 IM SUSP
0.5000 mL | Freq: Once | INTRAMUSCULAR | Status: AC
  Administered 2018-06-17: 0.5 mL via INTRAMUSCULAR
  Filled 2018-06-17: qty 0.5

## 2018-06-17 MED ORDER — LIDOCAINE-EPINEPHRINE (PF) 2 %-1:200000 IJ SOLN
10.0000 mL | Freq: Once | INTRAMUSCULAR | Status: DC
  Filled 2018-06-17: qty 20

## 2018-06-17 MED ORDER — IOPAMIDOL (ISOVUE-300) INJECTION 61%
100.0000 mL | Freq: Once | INTRAVENOUS | Status: AC | PRN
  Administered 2018-06-17: 100 mL via INTRAVENOUS

## 2018-06-17 MED ORDER — FENTANYL CITRATE (PF) 100 MCG/2ML IJ SOLN
50.0000 ug | Freq: Once | INTRAMUSCULAR | Status: AC
  Administered 2018-06-17: 50 ug via INTRAVENOUS
  Filled 2018-06-17: qty 2

## 2018-06-17 MED ORDER — METHOCARBAMOL 500 MG PO TABS: 500.0000 mg | ORAL_TABLET | Freq: Three times a day (TID) | ORAL | 0 refills | Status: DC | PRN

## 2018-06-17 NOTE — ED Notes (Signed)
RN took pt to CT.
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
## 2018-06-17 NOTE — ED Notes (Signed)
Pt seen walking out of ED with Family, Per tech pt had discharge paper with them.

## 2018-06-17 NOTE — ED Notes (Signed)
Suture complete, family at the bedside

## 2018-06-17 NOTE — Discharge Instructions (Signed)
Have the stitches out in around 10 to 14 days.

## 2018-07-22 ENCOUNTER — Emergency Department (HOSPITAL_COMMUNITY)
Admission: EM | Admit: 2018-07-22 | Discharge: 2018-07-22 | Disposition: A | Discharge status: Home / Self Care | Payer: Self-pay | Attending: Emergency Medicine | Admitting: Emergency Medicine

## 2018-07-22 ENCOUNTER — Encounter (HOSPITAL_COMMUNITY): Payer: Self-pay | Admitting: Emergency Medicine

## 2018-07-22 DIAGNOSIS — R600 Localized edema: Secondary | ICD-10-CM | POA: Insufficient documentation

## 2018-07-22 DIAGNOSIS — F909 Attention-deficit hyperactivity disorder, unspecified type: Secondary | ICD-10-CM | POA: Insufficient documentation

## 2018-07-22 DIAGNOSIS — F419 Anxiety disorder, unspecified: Secondary | ICD-10-CM | POA: Insufficient documentation

## 2018-07-22 DIAGNOSIS — F17210 Nicotine dependence, cigarettes, uncomplicated: Secondary | ICD-10-CM | POA: Insufficient documentation

## 2018-07-22 DIAGNOSIS — R609 Edema, unspecified: Secondary | ICD-10-CM

## 2018-07-22 LAB — CBC WITH DIFFERENTIAL/PLATELET
Abs Immature Granulocytes: 0.01 10*3/uL (ref 0.00–0.07)
Basophils Absolute: 0 10*3/uL (ref 0.0–0.1)
Basophils Relative: 0 %
Eosinophils Absolute: 0.1 10*3/uL (ref 0.0–0.5)
Eosinophils Relative: 1 %
HCT: 39.8 % (ref 39.0–52.0)
Hemoglobin: 12.4 g/dL — ABNORMAL LOW (ref 13.0–17.0)
IMMATURE GRANULOCYTES: 0 %
Lymphocytes Relative: 32 %
Lymphs Abs: 2 10*3/uL (ref 0.7–4.0)
MCH: 30.6 pg (ref 26.0–34.0)
MCHC: 31.2 g/dL (ref 30.0–36.0)
MCV: 98.3 fL (ref 80.0–100.0)
MONOS PCT: 8 %
Monocytes Absolute: 0.5 10*3/uL (ref 0.1–1.0)
NEUTROS PCT: 59 %
Neutro Abs: 3.7 10*3/uL (ref 1.7–7.7)
PLATELETS: 302 10*3/uL (ref 150–400)
RBC: 4.05 MIL/uL — ABNORMAL LOW (ref 4.22–5.81)
RDW: 13.1 % (ref 11.5–15.5)
WBC: 6.3 10*3/uL (ref 4.0–10.5)
nRBC: 0 % (ref 0.0–0.2)

## 2018-07-22 LAB — COMPREHENSIVE METABOLIC PANEL
ALK PHOS: 74 U/L (ref 38–126)
ALT: 52 U/L — ABNORMAL HIGH (ref 0–44)
ANION GAP: 11 (ref 5–15)
AST: 44 U/L — ABNORMAL HIGH (ref 15–41)
Albumin: 3.4 g/dL — ABNORMAL LOW (ref 3.5–5.0)
BILIRUBIN TOTAL: 0.3 mg/dL (ref 0.3–1.2)
BUN: 8 mg/dL (ref 6–20)
CALCIUM: 8.6 mg/dL — AB (ref 8.9–10.3)
CO2: 25 mmol/L (ref 22–32)
Chloride: 101 mmol/L (ref 98–111)
Creatinine, Ser: 0.73 mg/dL (ref 0.61–1.24)
GFR calc non Af Amer: >60 mL/min (ref >60)
GLUCOSE: 149 mg/dL — AB (ref 70–99)
Potassium: 3.2 mmol/L — ABNORMAL LOW (ref 3.5–5.1)
Sodium: 137 mmol/L (ref 135–145)
TOTAL PROTEIN: 6.7 g/dL (ref 6.5–8.1)

## 2018-07-22 LAB — URINALYSIS, ROUTINE W REFLEX MICROSCOPIC
BILIRUBIN URINE: NEGATIVE
GLUCOSE, UA: NEGATIVE mg/dL
HGB URINE DIPSTICK: NEGATIVE
Ketones, ur: NEGATIVE mg/dL
LEUKOCYTES UA: NEGATIVE
Nitrite: NEGATIVE
PROTEIN: NEGATIVE mg/dL
Specific Gravity, Urine: 1.025 (ref 1.005–1.030)
pH: 5 (ref 5.0–8.0)

## 2018-07-22 MED ORDER — LIDOCAINE VISCOUS HCL 2 % MT SOLN: 15.0000 mL | OROMUCOSAL | 0 refills | Status: DC | PRN

## 2018-07-22 MED ORDER — DEXAMETHASONE SODIUM PHOSPHATE 10 MG/ML IJ SOLN
10.0000 mg | Freq: Once | INTRAMUSCULAR | Status: AC
  Administered 2018-07-22: 10 mg via INTRAMUSCULAR
  Filled 2018-07-22: qty 1

## 2018-07-22 MED ORDER — PREDNISONE 20 MG PO TABS: 40.0000 mg | ORAL_TABLET | Freq: Every day | ORAL | 0 refills | Status: DC

## 2018-07-22 MED ORDER — TRAMADOL HCL 50 MG PO TABS
50.0000 mg | ORAL_TABLET | Freq: Once | ORAL | Status: AC
  Administered 2018-07-22: 50 mg via ORAL
  Filled 2018-07-22: qty 1

## 2018-07-22 MED ORDER — TRAMADOL HCL 50 MG PO TABS: 50.0000 mg | ORAL_TABLET | Freq: Four times a day (QID) | ORAL | 0 refills | Status: DC | PRN

## 2018-07-22 NOTE — ED Triage Notes (Signed)
Patient reports bilateral hand and foot swelling x4 days, he reports "blisters" to tips of fingers and legs. Pt reports chills a few days ago.

## 2018-07-22 NOTE — Discharge Instructions (Signed)
As discussed, your evaluation today has been largely reassuring.  But, it is important that you monitor your condition carefully, and do not hesitate to return to the ED if you develop new, or concerning changes in your condition. ? ?Otherwise, please follow-up with your physician for appropriate ongoing care. ? ?

## 2018-07-22 NOTE — ED Provider Notes (Signed)
[facility] EMERGENCY DEPARTMENT Provider Note   CSN: [id number] Arrival date & time: 07/22/18  1324     History   Chief Complaint Chief Complaint  Patient presents with  . Hand Pain  . Foot Swelling    HPI Austin Bryant is a 32 y.o. male.  HPI Previously well young male presents with concern of bilateral hand and foot swelling. Patient works, seemingly doing Medical laboratory scientific officer. He notes that he has had swelling for an unspecified amount of time, but has had increasing soreness in all 4 distal extremities over the past few days. There is small abrasions in his hands as well. It is unclear what he has been using for pain control, but it seems as OTC pain medicine is not working. No fever, no chills, no nausea, vomiting Patient also has recently sustained injury to his face, which was treated at home by his mother, with a Band-Aid, he requests evaluation. Past Medical History:  Diagnosis Date  . ADHD (attention deficit hyperactivity disorder)   . Anxiety   . Headache(784.0)   . Hx MRSA infection 2008   of leg    Patient Active Problem List   Diagnosis Date Noted  . 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 CONTUSION, SHOULDER 05/30/2009    Past Surgical History:  Procedure Laterality Date  . EYE SURGERY    . LACRIMAL TUBE INSERTION  10/19/2011   Procedure: LACRIMAL TUBE INSERTION;  Surgeon: Susa Simmonds, MD;  Location: [facility];  Service: Ophthalmology;  Laterality: Right;  Repair of Laceration of Lacrimal System Right Lower Lid; monostent placed into right lacrimal duct by Dr. Lita Mains, 10/19/2011 @ 1009; lot# [id number]  . NO PAST SURGERIES          Home Medications    Prior to Admission medications   Medication Sig Start Date End Date Taking? Authorizing Provider  clindamycin (CLEOCIN) 300 MG capsule Take 1 capsule (300 mg total) by mouth 4 (four) times daily. X 7 days Patient not taking: Reported on 07/22/2018  02/26/18   Geoffery Lyons, MD  doxycycline (VIBRAMYCIN) 100 MG capsule Take 1 capsule (100 mg total) by mouth 2 (two) times daily. One po bid x 7 days Patient not taking: Reported on 07/22/2018 03/07/18   Fayrene Helper, PA-C  methocarbamol (ROBAXIN) 500 MG tablet Take 1 tablet (500 mg total) by mouth every 8 (eight) hours as needed for muscle spasms. Patient not taking: Reported on 07/22/2018 06/17/18   Benjiman Core, MD  morphine (MSIR) 15 MG tablet Take 1 tablet (15 mg total) by mouth every 4 (four) hours as needed for severe pain. Patient not taking: Reported on 07/22/2018 09/28/17   Melene Plan, DO  oxyCODONE-acetaminophen (PERCOCET) 5-325 MG tablet Take 1 tablet by mouth every 6 (six) hours as needed for moderate pain or severe pain. Patient not taking: Reported on 07/22/2018 03/07/18   Fayrene Helper, PA-C  predniSONE (DELTASONE) 20 MG tablet Take 2 tablets (40 mg total) by mouth daily with breakfast. For the next four days 07/22/18   Gerhard Munch, MD  traMADol (ULTRAM) 50 MG tablet Take 1 tablet (50 mg total) by mouth every 6 (six) hours as needed. 07/22/18   Gerhard Munch, MD    Family History Family History  Problem Relation Age of Onset  . Anesthesia problems Neg Hx   . Hypotension Neg Hx   . Malignant hyperthermia Neg Hx   . Pseudochol deficiency Neg Hx     Social History Social History  Tobacco Use  . Smoking status: Current Every Day Smoker    Packs/day: 0.50    Years: 10.00    Pack years: 5.00    Types: Cigarettes  . Smokeless tobacco: Never Used  Substance Use Topics  . Alcohol use: Yes    Comment: socially  . Drug use: Yes    Types: IV, Marijuana    Comment: heroin     Allergies   Bee venom; Ibuprofen; Penicillins; and Tape   Review of Systems Review of Systems  Constitutional:       Per HPI, otherwise negative  HENT:       Sore thraot  Respiratory:       Per HPI, otherwise negative  Cardiovascular:       Per HPI, otherwise negative    Gastrointestinal: Negative for vomiting.  Endocrine:       Negative aside from HPI  Genitourinary:       Neg aside from HPI   Musculoskeletal:       Per HPI, otherwise negative  Skin: Positive for wound.  Neurological: Negative for syncope and weakness.     Physical Exam Updated Vital Signs BP 122/77 (BP Location: Right Arm)   Pulse 71   Temp 98.5 F (36.9 C) (Oral)   Resp 16   SpO2 96%   Physical Exam Vitals signs and nursing note reviewed.  Constitutional:      General: He is not in acute distress.    Appearance: He is well-developed.  HENT:     Head: Normocephalic.   Eyes:     Conjunctiva/sclera: Conjunctivae normal.  Cardiovascular:     Rate and Rhythm: Normal rate and regular rhythm.  Pulmonary:     Effort: Pulmonary effort is normal. No respiratory distress.     Breath sounds: No stridor.  Abdominal:     General: There is no distension.  Musculoskeletal:     Comments: Both hands are slightly swollen, though without any loss of range of motion, or function. Multiple small abrasions throughout with no confluent erythema.  Skin:    General: Skin is warm and dry.  Neurological:     Mental Status: He is alert and oriented to person, place, and time.      ED Treatments / Results  Labs (all labs ordered are listed, but only abnormal results are displayed) Labs Reviewed  COMPREHENSIVE METABOLIC PANEL - Abnormal; Notable for the following components:      Result Value   Potassium 3.2 (*)    Glucose, Bld 149 (*)    Calcium 8.6 (*)    Albumin 3.4 (*)    AST 44 (*)    ALT 52 (*)    All other components within normal limits  CBC WITH DIFFERENTIAL/PLATELET - Abnormal; Notable for the following components:   RBC 4.05 (*)    Hemoglobin 12.4 (*)    All other components within normal limits  URINALYSIS, ROUTINE W REFLEX MICROSCOPIC     Procedures Procedures (including critical care time)  Medications Ordered in ED Medications  dexamethasone (DECADRON)  injection 10 mg (10 mg Intramuscular Given 07/22/18 1709)  traMADol (ULTRAM) tablet 50 mg (50 mg Oral Given 07/22/18 1709)     Initial Impression / Assessment and Plan / ED Course  I have reviewed the triage vital signs and the nursing notes.  Pertinent labs & imaging results that were available during my care of the patient were reviewed by me and considered in my medical decision making (see chart for details).  5:37 PM Patient awake, alert. He is sitting with a blanket covering his head, but uncovers quickly, speaks clearly. We discussed all findings. Labs reassuring, mild electrolyte abnormalities, but no substantial, life-threatening findings. Patient has multiple prior narcotic prescriptions, today will receive tramadol, steroids for extremity swelling, some suspicion for work-related changes. Patient also has sore throat, though no evidence for acute pharyngitis, with no leukocytosis, no fever low suspicion for strep throat or other acute infection.   Final Clinical Impressions(s) / ED Diagnoses   Final diagnoses:  Peripheral edema    ED Discharge Orders         Ordered    predniSONE (DELTASONE) 20 MG tablet  Daily with breakfast     07/22/18 1737    traMADol (ULTRAM) 50 MG tablet  Every 6 hours PRN     07/22/18 1737           Gerhard Munch, MD 07/22/18 [phone]

## 2018-08-06 DIAGNOSIS — K759 Inflammatory liver disease, unspecified: Secondary | ICD-10-CM

## 2018-08-06 HISTORY — DX: Inflammatory liver disease, unspecified: K75.9

## 2018-12-31 ENCOUNTER — Other Ambulatory Visit: Payer: Self-pay

## 2018-12-31 ENCOUNTER — Emergency Department (HOSPITAL_COMMUNITY): Payer: Self-pay

## 2018-12-31 ENCOUNTER — Emergency Department (HOSPITAL_COMMUNITY)
Admission: EM | Admit: 2018-12-31 | Discharge: 2018-12-31 | Disposition: A | Discharge status: Home / Self Care | Payer: Self-pay | Attending: Emergency Medicine | Admitting: Emergency Medicine

## 2018-12-31 ENCOUNTER — Encounter (HOSPITAL_COMMUNITY): Payer: Self-pay | Admitting: Emergency Medicine

## 2018-12-31 DIAGNOSIS — Y939 Activity, unspecified: Secondary | ICD-10-CM | POA: Insufficient documentation

## 2018-12-31 DIAGNOSIS — S52692A Other fracture of lower end of left ulna, initial encounter for closed fracture: Secondary | ICD-10-CM | POA: Insufficient documentation

## 2018-12-31 DIAGNOSIS — Y929 Unspecified place or not applicable: Secondary | ICD-10-CM | POA: Insufficient documentation

## 2018-12-31 DIAGNOSIS — F17210 Nicotine dependence, cigarettes, uncomplicated: Secondary | ICD-10-CM | POA: Insufficient documentation

## 2018-12-31 DIAGNOSIS — Y999 Unspecified external cause status: Secondary | ICD-10-CM | POA: Insufficient documentation

## 2018-12-31 MED ORDER — OXYCODONE-ACETAMINOPHEN 5-325 MG PO TABS
1.0000 | ORAL_TABLET | Freq: Once | ORAL | Status: AC
  Administered 2018-12-31: 1 via ORAL
  Filled 2018-12-31: qty 1

## 2018-12-31 MED ORDER — HYDROCODONE-ACETAMINOPHEN 5-325 MG PO TABS: ORAL_TABLET | ORAL | 0 refills | Status: DC

## 2018-12-31 NOTE — ED Notes (Signed)
Patient transported to X-ray 
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
## 2018-12-31 NOTE — Discharge Instructions (Addendum)
Elevate your arm when possible.  Dr. Glenna Durand office will see you next week, you can contact his office for an appt time.  If you cannot get transportation, you can contact Dr. Mort Sawyers office to arrange a follow-up

## 2020-02-19 ENCOUNTER — Other Ambulatory Visit: Payer: Self-pay

## 2020-02-19 ENCOUNTER — Ambulatory Visit
Admission: EM | Admit: 2020-02-19 | Discharge: 2020-02-19 | Disposition: A | Discharge status: Home / Self Care | Payer: Self-pay | Attending: Emergency Medicine | Admitting: Emergency Medicine

## 2020-02-19 ENCOUNTER — Ambulatory Visit: Payer: Self-pay

## 2020-02-19 DIAGNOSIS — J029 Acute pharyngitis, unspecified: Secondary | ICD-10-CM | POA: Insufficient documentation

## 2020-02-19 MED ORDER — MENTHOL 3 MG MT LOZG: 1.0000 | LOZENGE | OROMUCOSAL | 0 refills | Status: DC | PRN

## 2020-02-19 MED ORDER — CETIRIZINE HCL 10 MG PO TABS: 10.0000 mg | ORAL_TABLET | Freq: Every day | ORAL | 0 refills | Status: DC

## 2020-02-19 MED ORDER — LIDOCAINE VISCOUS HCL 2 % MT SOLN
15.0000 mL | OROMUCOSAL | 1 refills | Status: DC | PRN
Start: 2020-02-19 — End: 2020-12-14

## 2020-02-19 NOTE — ED Provider Notes (Signed)
[facility]   [id number] 02/19/20 Arrival Time: 1645  Chief Complaint  Patient presents with  . Sore Throat     SUBJECTIVE: History from: patient.  Austin Bryant is a 34 y.o. male who presents to the urgent care with a complaint of sore throat for the past 2 days.  Denies sick exposure to strep, flu or mono, or precipitating event.  Has not tried any OTC medication.  Symptoms are made worse with swallowing, but tolerating liquids and own secretions without difficulty.  Report previous symptoms in the past.  Denies fever, chills, fatigue, ear pain, sinus pain, rhinorrhea, nasal congestion, cough, SOB, wheezing, chest pain, nausea, rash, changes in bowel or bladder habits.      ROS: As per HPI.  All other pertinent ROS negative.     History reviewed. No pertinent past medical history. History reviewed. No pertinent surgical history. Allergies  Allergen Reactions  . Penicillins Rash   No current facility-administered medications on file prior to encounter.   No current outpatient medications on file prior to encounter.   Social History   Socioeconomic History  . Marital status: Single    Spouse name: Not on file  . Number of children: Not on file  . Years of education: Not on file  . Highest education level: Not on file  Occupational History  . Not on file  Tobacco Use  . Smoking status: Never Smoker  . Smokeless tobacco: Never Used  Substance and Sexual Activity  . Alcohol use: Not on file  . Drug use: Not on file  . Sexual activity: Not on file  Other Topics Concern  . Not on file  Social History Narrative  . Not on file   Social Determinants of Health   Financial Resource Strain:   . Difficulty of Paying Living Expenses:   Food Insecurity:   . Worried About Programme researcher, broadcasting/film/video in the Last Year:   . Barista in the Last Year:   Transportation Needs:   . Freight forwarder (Medical):   Marland Kitchen Lack of Transportation (Non-Medical):    Physical Activity:   . Days of Exercise per Week:   . Minutes of Exercise per Session:   Stress:   . Feeling of Stress :   Social Connections:   . Frequency of Communication with Friends and Family:   . Frequency of Social Gatherings with Friends and Family:   . Attends Religious Services:   . Active Member of Clubs or Organizations:   . Attends Banker Meetings:   Marland Kitchen Marital Status:   Intimate Partner Violence:   . Fear of Current or Ex-Partner:   . Emotionally Abused:   Marland Kitchen Physically Abused:   . Sexually Abused:    History reviewed. No pertinent family history.  OBJECTIVE:  Vitals:   02/19/20 1749  BP: 132/84  Pulse: 76  Resp: 15  Temp: 98.9 F (37.2 C)  TempSrc: Oral  SpO2: 98%     General appearance: alert; appears fatigued, but nontoxic, speaking in full sentences and managing own secretions HEENT: NCAT; Ears: EACs clear, TMs pearly gray with visible cone of light, without erythema; Eyes: PERRL, EOMI grossly; Nose: no obvious rhinorrhea; Throat: oropharynx clear, tonsils 1+ and mildly erythematous without white tonsillar exudates, uvula midline Neck: supple without LAD Lungs: CTA bilaterally without adventitious breath sounds; cough absent Heart: regular rate and rhythm.  Radial pulses 2+ symmetrical bilaterally Skin: warm and dry Psychological: alert and cooperative; normal mood and  affect  LABS: No results found for this or any previous visit (from the past 24 hour(s)).   ASSESSMENT & PLAN:  1. Sore throat     Meds ordered this encounter  Medications  . lidocaine (XYLOCAINE) 2 % solution    Sig: Use as directed 15 mLs in the mouth or throat as needed for mouth pain.    Dispense:  100 mL    Refill:  1  . cetirizine (ZYRTEC ALLERGY) 10 MG tablet    Sig: Take 1 tablet (10 mg total) by mouth daily.    Dispense:  30 tablet    Refill:  0  . menthol-cetylpyridinium (CEPACOL) 3 MG lozenge    Sig: Take 1 lozenge (3 mg total) by mouth as needed  for sore throat.    Dispense:  100 tablet    Refill:  0   Discharge instructions  Strep test negative, will send out for culture and we will call you with results Get plenty of rest and push fluids Zyrtec D prescribed. Use daily for symptomatic relief Viscous lidocaine prescribed.  This is an oral solution you can swish, and gargle as needed for symptomatic relief of sore throat.  Do not exceed 8 doses in a 24 hour period.  Do not use prior to eating, as this will numb your entire mouth.   Drink warm or cool liquids, use throat lozenges, or popsicles to help alleviate symptoms Take OTC ibuprofen or tylenol as needed for pain Follow up with PCP if symptoms persists Return or go to ER if patient has any new or worsening symptoms such as fever, chills, nausea, vomiting, worsening sore throat, cough, abdominal pain, chest pain, changes in bowel or bladder habits, etc...  Reviewed expectations re: course of current medical issues. Questions answered. Outlined signs and symptoms indicating need for more acute intervention. Patient verbalized understanding. After Visit Summary given.     Note: This document was prepared using Dragon voice recognition software and may include unintentional dictation errors.    Durward Parcel, FNP 02/19/20 1857

## 2020-02-19 NOTE — Discharge Instructions (Signed)
Strep test negative, will send out for culture and we will call you with results Get plenty of rest and push fluids Zyrtec D prescribed. Use daily for symptomatic relief Viscous lidocaine prescribed.  This is an oral solution you can swish, and gargle as needed for symptomatic relief of sore throat.  Do not exceed 8 doses in a 24 hour period.  Do not use prior to eating, as this will numb your entire mouth.   Drink warm or cool liquids, use throat lozenges, or popsicles to help alleviate symptoms Take OTC ibuprofen or tylenol as needed for pain Follow up with PCP if symptoms persists Return or go to ER if patient has any new or worsening symptoms such as fever, chills, nausea, vomiting, worsening sore throat, cough, abdominal pain, chest pain, changes in bowel or bladder habits, etc. 

## 2020-02-19 NOTE — ED Triage Notes (Signed)
Patient reports sore throat for 2 days.

## 2020-02-21 ENCOUNTER — Telehealth (HOSPITAL_COMMUNITY): Payer: Self-pay | Admitting: Emergency Medicine

## 2020-02-21 LAB — CULTURE, GROUP A STREP (THRC)

## 2020-02-21 MED ORDER — AZITHROMYCIN 250 MG PO TABS: 250.0000 mg | ORAL_TABLET | Freq: Every day | ORAL | 0 refills | Status: DC

## 2020-02-21 NOTE — Telephone Encounter (Signed)
Called patient and verified identity using two identifiers.  Provided positive strep culture result.  Patient with allergy to penicillin, Azithromycin 500mg  Day 1 then 250mg  Days 2-5 sent to pharmacy requested by patient.

## 2020-04-21 ENCOUNTER — Other Ambulatory Visit: Payer: Self-pay

## 2020-04-21 DIAGNOSIS — Z20822 Contact with and (suspected) exposure to covid-19: Secondary | ICD-10-CM

## 2020-04-23 LAB — SARS-COV-2, NAA 2 DAY TAT

## 2020-04-23 LAB — NOVEL CORONAVIRUS, NAA: SARS-CoV-2, NAA: NOT DETECTED

## 2020-11-03 ENCOUNTER — Emergency Department (HOSPITAL_COMMUNITY)
Admission: EM | Admit: 2020-11-03 | Discharge: 2020-11-04 | Disposition: A | Discharge status: Home / Self Care | Payer: No Typology Code available for payment source | Attending: Emergency Medicine | Admitting: Emergency Medicine

## 2020-11-03 ENCOUNTER — Emergency Department (HOSPITAL_COMMUNITY): Payer: No Typology Code available for payment source

## 2020-11-03 ENCOUNTER — Other Ambulatory Visit: Payer: Self-pay

## 2020-11-03 ENCOUNTER — Encounter (HOSPITAL_COMMUNITY): Payer: Self-pay | Admitting: Emergency Medicine

## 2020-11-03 DIAGNOSIS — S92324A Nondisplaced fracture of second metatarsal bone, right foot, initial encounter for closed fracture: Secondary | ICD-10-CM | POA: Insufficient documentation

## 2020-11-03 DIAGNOSIS — Y92410 Unspecified street and highway as the place of occurrence of the external cause: Secondary | ICD-10-CM | POA: Diagnosis not present

## 2020-11-03 DIAGNOSIS — M542 Cervicalgia: Secondary | ICD-10-CM | POA: Insufficient documentation

## 2020-11-03 DIAGNOSIS — M545 Low back pain, unspecified: Secondary | ICD-10-CM | POA: Diagnosis not present

## 2020-11-03 DIAGNOSIS — S161XXA Strain of muscle, fascia and tendon at neck level, initial encounter: Secondary | ICD-10-CM

## 2020-11-03 DIAGNOSIS — S39012A Strain of muscle, fascia and tendon of lower back, initial encounter: Secondary | ICD-10-CM

## 2020-11-03 DIAGNOSIS — S99921A Unspecified injury of right foot, initial encounter: Secondary | ICD-10-CM | POA: Diagnosis present

## 2020-11-03 MED ORDER — MORPHINE SULFATE (PF) 4 MG/ML IV SOLN
4.0000 mg | Freq: Once | INTRAVENOUS | Status: AC
  Administered 2020-11-04: 4 mg via INTRAVENOUS
  Filled 2020-11-03: qty 1
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
## 2020-11-03 NOTE — ED Notes (Signed)
Nurse into room. Pt states "Are you the doctor? I need pain medicine my neck hurts so BAD!" When informed that no I am just the nurse that is going to be caring for him. He stated "GET ME THE F* OUT OF HERE AND TAKE ME TO ANOTHER HOSPITAL. THIS IS RIDICULOUS, I NEED PAIN MEDICINE NOW!" Nurse explained to the patient that he will see the doctor soon and that he will order test and medication as needed. Pt just moaned in anger and stated could he at least have a blanket because he was cold. Pt was given a warm blanket at this time.

## 2020-11-04 ENCOUNTER — Emergency Department (HOSPITAL_COMMUNITY): Payer: No Typology Code available for payment source

## 2020-11-04 LAB — CBC WITH DIFFERENTIAL/PLATELET
Abs Immature Granulocytes: 0.03 10*3/uL (ref 0.00–0.07)
Basophils Absolute: 0 10*3/uL (ref 0.0–0.1)
Basophils Relative: 0 %
Eosinophils Absolute: 0.2 10*3/uL (ref 0.0–0.5)
Eosinophils Relative: 2 %
HCT: 40.9 % (ref 39.0–52.0)
Hemoglobin: 13.7 g/dL (ref 13.0–17.0)
Immature Granulocytes: 0 %
Lymphocytes Relative: 23 %
Lymphs Abs: 2.3 10*3/uL (ref 0.7–4.0)
MCH: 33.4 pg (ref 26.0–34.0)
MCHC: 33.5 g/dL (ref 30.0–36.0)
MCV: 99.8 fL (ref 80.0–100.0)
Monocytes Absolute: 0.5 10*3/uL (ref 0.1–1.0)
Monocytes Relative: 5 %
Neutro Abs: 7 10*3/uL (ref 1.7–7.7)
Neutrophils Relative %: 70 %
Platelets: 245 10*3/uL (ref 150–400)
RBC: 4.1 MIL/uL — ABNORMAL LOW (ref 4.22–5.81)
RDW: 12.7 % (ref 11.5–15.5)
WBC: 10 10*3/uL (ref 4.0–10.5)
nRBC: 0 % (ref 0.0–0.2)

## 2020-11-04 LAB — I-STAT CHEM 8, ED
BUN: 19 mg/dL (ref 6–20)
Calcium, Ion: 1.11 mmol/L — ABNORMAL LOW (ref 1.15–1.40)
Chloride: 108 mmol/L (ref 98–111)
Creatinine, Ser: 0.8 mg/dL (ref 0.61–1.24)
Glucose, Bld: 99 mg/dL (ref 70–99)
HCT: 39 % (ref 39.0–52.0)
Hemoglobin: 13.3 g/dL (ref 13.0–17.0)
Potassium: 4.6 mmol/L (ref 3.5–5.1)
Sodium: 139 mmol/L (ref 135–145)
TCO2: 24 mmol/L (ref 22–32)

## 2020-11-04 MED ORDER — IOHEXOL 300 MG/ML  SOLN
100.0000 mL | Freq: Once | INTRAMUSCULAR | Status: AC | PRN
  Administered 2020-11-04: 100 mL via INTRAVENOUS

## 2020-11-04 MED ORDER — OXYCODONE-ACETAMINOPHEN 5-325 MG PO TABS: 1.0000 | ORAL_TABLET | Freq: Four times a day (QID) | ORAL | 0 refills | Status: DC | PRN

## 2020-11-04 MED ORDER — MORPHINE SULFATE (PF) 4 MG/ML IV SOLN
4.0000 mg | Freq: Once | INTRAVENOUS | Status: AC
  Administered 2020-11-04: 4 mg via INTRAVENOUS
  Filled 2020-11-04: qty 1

## 2020-11-04 NOTE — ED Notes (Signed)
Staff heard pt cursing and yelling at girlfriend. Staff reported to nurse that pt was cursing and yelling at girlfriend. Nurse stated to tell girlfriend that it was time for her to leave. Staff reported info to pt's girlfriend to leave, pt jumped up out of bed as if he was going to leave because staff asked pt to leave. Pt was very angry and yelling at staff. Staff reported to nurse. Nurse called security. At this time girlfriend is still with pt in the room.

## 2020-11-04 NOTE — Discharge Instructions (Addendum)
Wear cam walker as applied for the next week until followed up by orthopedics.  Keep your foot elevated as much as possible for the next several days.  Ice for 20 minutes every 2 hours while awake for the next 2 days.  Take Percocet as prescribed as needed for pain.  Follow-up with orthopedics in 1 week.  The contact information for Dr. Romeo Apple has been provided in this discharge summary for you to call and make these arrangements.

## 2020-11-17 ENCOUNTER — Other Ambulatory Visit: Payer: Self-pay

## 2020-11-17 ENCOUNTER — Encounter: Payer: Self-pay | Admitting: Orthopaedic Surgery

## 2020-11-17 ENCOUNTER — Ambulatory Visit: Payer: Self-pay

## 2020-11-17 ENCOUNTER — Telehealth: Payer: Self-pay | Admitting: Radiology

## 2020-11-17 ENCOUNTER — Ambulatory Visit (INDEPENDENT_AMBULATORY_CARE_PROVIDER_SITE_OTHER): Payer: Self-pay | Admitting: Orthopaedic Surgery

## 2020-11-17 VITALS — Ht 77.0 in

## 2020-11-17 DIAGNOSIS — S92324A Nondisplaced fracture of second metatarsal bone, right foot, initial encounter for closed fracture: Secondary | ICD-10-CM

## 2020-11-17 DIAGNOSIS — F17210 Nicotine dependence, cigarettes, uncomplicated: Secondary | ICD-10-CM

## 2020-11-17 DIAGNOSIS — M79671 Pain in right foot: Secondary | ICD-10-CM

## 2020-11-17 MED ORDER — HYDROCODONE-ACETAMINOPHEN 5-325 MG PO TABS: 1.0000 | ORAL_TABLET | ORAL | 0 refills | Status: DC | PRN
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
## 2020-11-17 NOTE — Telephone Encounter (Signed)
Tylenol is in both products, hydrocodone and oxycodone narcotics.  Stay with the hydrocodone.

## 2020-11-17 NOTE — Telephone Encounter (Signed)
Patient called back and said that he is allergic to tylenol, he was mistaken when he listed an ibuprofen allergy.  He is asking for a prescription of oxycodone instead of hydrocodone.  Please advise.

## 2020-11-17 NOTE — Telephone Encounter (Signed)
I called pt and left msg advising.

## 2020-11-17 NOTE — Patient Instructions (Signed)
Out of work four more weeks.

## 2020-11-21 ENCOUNTER — Telehealth: Payer: Self-pay | Admitting: Orthopaedic Surgery

## 2020-11-21 NOTE — Telephone Encounter (Signed)
Too early.  Can consider Thursday morning.

## 2020-11-21 NOTE — Telephone Encounter (Signed)
I called patient and advised per Dr Hilda Lias that it is too early to refill and to call back Thursday to ask for refill. Patient states he only had a 5 day supply, and he is hurting so much. He is taking them as directed he says, and he is out.  The pain is still there, the hydrocodone only took the edge off the pain.  I discussed elevation above heart level and ice, he is doing both.  I advised him I would let you know how he is doing, and you could advise further.  Advised him it Haniyah Maciolek still be Thursday before request can be looked at again.  Please advise if any other direction to be given to patient?  Thank you-

## 2020-11-21 NOTE — Telephone Encounter (Signed)
Patient requests Hydrocodone/Acetaminophen 5-325 mgs.  Qty  30  Sig: Take 1 tablet by mouth every 4 (four) hours as needed for up to 5 days for moderate pain.  Patient states he uses [facility] in  [place]
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
## 2020-11-22 NOTE — Telephone Encounter (Signed)
Patient called back again stating that he thinks "the doctor is confusing him with someone else."  I reminded him that the doctor has already stated that he would readdress the medication on Thursday.  He is still not happy with this answer.    He wants [place] to have Dr. Hilda Lias call him or he wants someone to explain what is going on.  Can you help with this?

## 2020-11-24 MED ORDER — HYDROCODONE-ACETAMINOPHEN 5-325 MG PO TABS: ORAL_TABLET | ORAL | 0 refills | Status: DC

## 2020-11-24 NOTE — Telephone Encounter (Signed)
Please advise on refill for patient, thanks.

## 2020-11-28 ENCOUNTER — Telehealth: Payer: Self-pay | Admitting: Orthopaedic Surgery

## 2020-11-28 NOTE — Telephone Encounter (Signed)
.    This was opened in error.

## 2020-12-01 ENCOUNTER — Ambulatory Visit (INDEPENDENT_AMBULATORY_CARE_PROVIDER_SITE_OTHER): Payer: Self-pay | Admitting: Orthopaedic Surgery

## 2020-12-01 ENCOUNTER — Encounter: Payer: Self-pay | Admitting: Orthopaedic Surgery

## 2020-12-01 ENCOUNTER — Ambulatory Visit: Payer: Self-pay

## 2020-12-01 ENCOUNTER — Other Ambulatory Visit: Payer: Self-pay

## 2020-12-01 VITALS — Ht 77.0 in | Wt 250.0 lb

## 2020-12-01 DIAGNOSIS — S92324S Nondisplaced fracture of second metatarsal bone, right foot, sequela: Secondary | ICD-10-CM

## 2020-12-01 MED ORDER — HYDROCODONE-ACETAMINOPHEN 5-325 MG PO TABS: ORAL_TABLET | ORAL | 0 refills | Status: DC

## 2020-12-01 NOTE — Progress Notes (Signed)
My foot is killing me.  He has been using the CAM walker on the right foot.  He has pain of the foot when he does not use it.  His pain has not subsided.  He has no new trauma.  He has no redness.  NV intact.  X-rays were done of the right foot, reported separately.  He has no callus of the fracture of the base of the second metatarsal.  Encounter Diagnosis  Name Primary?  . Closed nondisplaced fracture of second metatarsal bone of right foot, sequela Yes   I will have Dr. Lajoyce Corners see him for another opinion and see if surgery is needed or just longer time to heal.  The patient's employer is asking him to return to work.    I have reviewed the [place] Controlled Substance Reporting System web site prior to prescribing narcotic medicine for this patient.   Electronically Signed Darreld Mclean, MD 4/28/202211:19 AM

## 2020-12-05 ENCOUNTER — Telehealth: Payer: Self-pay | Admitting: Orthopaedic Surgery

## 2020-12-05 NOTE — Telephone Encounter (Signed)
Patient called and was confused about his medication and called the pharmacy and it is there now

## 2020-12-06 ENCOUNTER — Ambulatory Visit: Payer: Self-pay | Admitting: Orthopedic Surgery

## 2020-12-08 ENCOUNTER — Encounter: Payer: Self-pay | Admitting: Orthopedic Surgery

## 2020-12-08 ENCOUNTER — Ambulatory Visit (INDEPENDENT_AMBULATORY_CARE_PROVIDER_SITE_OTHER): Payer: Self-pay | Admitting: Orthopedic Surgery

## 2020-12-08 DIAGNOSIS — S93324A Dislocation of tarsometatarsal joint of right foot, initial encounter: Secondary | ICD-10-CM

## 2020-12-09 ENCOUNTER — Telehealth: Payer: Self-pay | Admitting: Orthopedic Surgery

## 2020-12-09 NOTE — Telephone Encounter (Signed)
Austin Bryant is scheduled for ORIF of his right foot Lisfranc fracture on Wednesday 12/14/20.  He states that he forgot to ask Dr. Lajoyce Corners for pain medication while he was in the office.  He was taking hydrocodone 5mg  (given by ).  [facility] [place]

## 2020-12-09 NOTE — Telephone Encounter (Signed)
Told patient that we in general we dont do pre op pain medication. He understood

## 2020-12-12 ENCOUNTER — Encounter: Payer: Self-pay | Admitting: Orthopedic Surgery

## 2020-12-12 ENCOUNTER — Other Ambulatory Visit: Payer: Self-pay | Admitting: Physician Assistant
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
## 2020-12-13 ENCOUNTER — Encounter (HOSPITAL_COMMUNITY): Payer: Self-pay | Admitting: Orthopedic Surgery

## 2020-12-13 ENCOUNTER — Other Ambulatory Visit: Payer: Self-pay

## 2020-12-13 NOTE — Progress Notes (Signed)
Austin Bryant denies chest pain or shortness of breath.  Austin Bryant states that he had 2 Covid vaccines, has not been in contact with anyone with Covid. Patient will be tested on arrival.

## 2020-12-14 ENCOUNTER — Encounter (HOSPITAL_COMMUNITY)
Admission: RE | Disposition: A | Discharge status: Home / Self Care | Payer: Self-pay | Source: Home / Self Care | Attending: Orthopedic Surgery

## 2020-12-14 ENCOUNTER — Ambulatory Visit (HOSPITAL_COMMUNITY): Payer: Self-pay

## 2020-12-14 ENCOUNTER — Encounter (HOSPITAL_COMMUNITY): Payer: Self-pay | Admitting: Orthopedic Surgery

## 2020-12-14 ENCOUNTER — Ambulatory Visit (HOSPITAL_COMMUNITY): Payer: Self-pay | Admitting: Anesthesiology

## 2020-12-14 ENCOUNTER — Ambulatory Visit (HOSPITAL_COMMUNITY)
Admission: RE | Admit: 2020-12-14 | Discharge: 2020-12-14 | Disposition: A | Discharge status: Home / Self Care | Payer: Self-pay | Attending: Orthopedic Surgery | Admitting: Orthopedic Surgery

## 2020-12-14 DIAGNOSIS — S93324A Dislocation of tarsometatarsal joint of right foot, initial encounter: Secondary | ICD-10-CM

## 2020-12-14 DIAGNOSIS — Z880 Allergy status to penicillin: Secondary | ICD-10-CM | POA: Insufficient documentation

## 2020-12-14 DIAGNOSIS — F17200 Nicotine dependence, unspecified, uncomplicated: Secondary | ICD-10-CM | POA: Insufficient documentation

## 2020-12-14 DIAGNOSIS — Z888 Allergy status to other drugs, medicaments and biological substances status: Secondary | ICD-10-CM | POA: Insufficient documentation

## 2020-12-14 DIAGNOSIS — S92901A Unspecified fracture of right foot, initial encounter for closed fracture: Secondary | ICD-10-CM

## 2020-12-14 DIAGNOSIS — Z87892 Personal history of anaphylaxis: Secondary | ICD-10-CM | POA: Insufficient documentation

## 2020-12-14 DIAGNOSIS — Z20822 Contact with and (suspected) exposure to covid-19: Secondary | ICD-10-CM | POA: Insufficient documentation

## 2020-12-14 DIAGNOSIS — Z91030 Bee allergy status: Secondary | ICD-10-CM | POA: Insufficient documentation

## 2020-12-14 DIAGNOSIS — Z886 Allergy status to analgesic agent status: Secondary | ICD-10-CM | POA: Insufficient documentation

## 2020-12-14 HISTORY — DX: Childhood onset fluency disorder: F80.81

## 2020-12-14 HISTORY — PX: FOOT ARTHRODESIS: SHX1655

## 2020-12-14 HISTORY — DX: Depression, unspecified: F32.A

## 2020-12-14 LAB — SURGICAL PCR SCREEN
MRSA, PCR: NEGATIVE
Staphylococcus aureus: POSITIVE — AB

## 2020-12-14 LAB — COMPREHENSIVE METABOLIC PANEL
ALT: 45 U/L — ABNORMAL HIGH (ref 0–44)
AST: 29 U/L (ref 15–41)
Albumin: 4 g/dL (ref 3.5–5.0)
Alkaline Phosphatase: 68 U/L (ref 38–126)
Anion gap: 8 (ref 5–15)
BUN: 10 mg/dL (ref 6–20)
CO2: 26 mmol/L (ref 22–32)
Calcium: 9.2 mg/dL (ref 8.9–10.3)
Chloride: 104 mmol/L (ref 98–111)
Creatinine, Ser: 1 mg/dL (ref 0.61–1.24)
GFR, Estimated: >60 mL/min (ref >60)
Glucose, Bld: 92 mg/dL (ref 70–99)
Potassium: 4 mmol/L (ref 3.5–5.1)
Sodium: 138 mmol/L (ref 135–145)
Total Bilirubin: 0.4 mg/dL (ref 0.3–1.2)
Total Protein: 6.7 g/dL (ref 6.5–8.1)

## 2020-12-14 LAB — RESP PANEL BY RT-PCR (FLU A&B, COVID) ARPGX2
Influenza A by PCR: NEGATIVE
Influenza B by PCR: NEGATIVE
SARS Coronavirus 2 by RT PCR: NEGATIVE

## 2020-12-14 SURGERY — FUSION, JOINT, FOOT
Anesthesia: Regional | Site: Foot | Laterality: Right

## 2020-12-14 MED ORDER — SODIUM CHLORIDE 0.9 % IR SOLN
Status: DC | PRN
  Administered 2020-12-14: 1000 mL

## 2020-12-14 MED ORDER — PROPOFOL 10 MG/ML IV BOLUS
INTRAVENOUS | Status: DC | PRN
  Administered 2020-12-14: 200 mg via INTRAVENOUS

## 2020-12-14 MED ORDER — DEXAMETHASONE SODIUM PHOSPHATE 10 MG/ML IJ SOLN
INTRAMUSCULAR | Status: AC
  Filled 2020-12-14: qty 1

## 2020-12-14 MED ORDER — OXYTOCIN 10 UNIT/ML IJ SOLN: INTRAMUSCULAR | Status: DC | PRN

## 2020-12-14 MED ORDER — SUGAMMADEX SODIUM 200 MG/2ML IV SOLN
INTRAVENOUS | Status: DC | PRN
  Administered 2020-12-14: 200 mg via INTRAVENOUS
  Administered 2020-12-14: 100 mg via INTRAVENOUS

## 2020-12-14 MED ORDER — DEXMEDETOMIDINE (PRECEDEX) IN NS 20 MCG/5ML (4 MCG/ML) IV SYRINGE
PREFILLED_SYRINGE | INTRAVENOUS | Status: DC | PRN
  Administered 2020-12-14: 12 ug via INTRAVENOUS
  Administered 2020-12-14: 8 ug via INTRAVENOUS
  Administered 2020-12-14: 12 ug via INTRAVENOUS

## 2020-12-14 MED ORDER — MIDAZOLAM HCL 2 MG/2ML IJ SOLN
INTRAMUSCULAR | Status: AC
  Filled 2020-12-14: qty 2

## 2020-12-14 MED ORDER — OXYCODONE-ACETAMINOPHEN 5-325 MG PO TABS: 1.0000 | ORAL_TABLET | ORAL | 0 refills | Status: DC | PRN

## 2020-12-14 MED ORDER — ONDANSETRON HCL 4 MG/2ML IJ SOLN
INTRAMUSCULAR | Status: DC | PRN
  Administered 2020-12-14: 4 mg via INTRAVENOUS

## 2020-12-14 MED ORDER — DEXAMETHASONE SODIUM PHOSPHATE 10 MG/ML IJ SOLN
INTRAMUSCULAR | Status: DC | PRN
  Administered 2020-12-14: 10 mg via INTRAVENOUS

## 2020-12-14 MED ORDER — LIDOCAINE 2% (20 MG/ML) 5 ML SYRINGE
INTRAMUSCULAR | Status: DC | PRN
  Administered 2020-12-14: 40 mg via INTRAVENOUS

## 2020-12-14 MED ORDER — CHLORHEXIDINE GLUCONATE 0.12 % MT SOLN
15.0000 mL | Freq: Once | OROMUCOSAL | Status: AC
  Administered 2020-12-14: 15 mL via OROMUCOSAL
  Filled 2020-12-14: qty 15

## 2020-12-14 MED ORDER — LIDOCAINE 2% (20 MG/ML) 5 ML SYRINGE
INTRAMUSCULAR | Status: AC
  Filled 2020-12-14: qty 5

## 2020-12-14 MED ORDER — FENTANYL CITRATE (PF) 250 MCG/5ML IJ SOLN
INTRAMUSCULAR | Status: DC | PRN
  Administered 2020-12-14 (×2): 100 ug via INTRAVENOUS

## 2020-12-14 MED ORDER — ORAL CARE MOUTH RINSE
15.0000 mL | Freq: Once | OROMUCOSAL | Status: AC
Start: 2020-12-14 — End: 2020-12-14

## 2020-12-14 MED ORDER — ROCURONIUM BROMIDE 10 MG/ML (PF) SYRINGE
PREFILLED_SYRINGE | INTRAVENOUS | Status: DC | PRN
  Administered 2020-12-14: 70 mg via INTRAVENOUS

## 2020-12-14 MED ORDER — MIDAZOLAM HCL 5 MG/5ML IJ SOLN
INTRAMUSCULAR | Status: DC | PRN
  Administered 2020-12-14: 2 mg via INTRAVENOUS

## 2020-12-14 MED ORDER — DIPHENHYDRAMINE HCL 50 MG/ML IJ SOLN
INTRAMUSCULAR | Status: DC | PRN
  Administered 2020-12-14: 12.5 mg via INTRAVENOUS

## 2020-12-14 MED ORDER — ROCURONIUM BROMIDE 10 MG/ML (PF) SYRINGE
PREFILLED_SYRINGE | INTRAVENOUS | Status: AC
  Filled 2020-12-14: qty 10

## 2020-12-14 MED ORDER — PROPOFOL 10 MG/ML IV BOLUS
INTRAVENOUS | Status: AC
  Filled 2020-12-14: qty 20

## 2020-12-14 MED ORDER — CLINDAMYCIN PHOSPHATE 900 MG/50ML IV SOLN
900.0000 mg | INTRAVENOUS | Status: AC
  Administered 2020-12-14: 900 mg via INTRAVENOUS
  Filled 2020-12-14: qty 50

## 2020-12-14 MED ORDER — ONDANSETRON HCL 4 MG/2ML IJ SOLN
INTRAMUSCULAR | Status: AC
  Filled 2020-12-14: qty 2

## 2020-12-14 MED ORDER — FENTANYL CITRATE (PF) 250 MCG/5ML IJ SOLN
INTRAMUSCULAR | Status: AC
  Filled 2020-12-14: qty 5

## 2020-12-14 MED ORDER — LACTATED RINGERS IV SOLN: INTRAVENOUS | Status: DC

## 2020-12-14 SURGICAL SUPPLY — 49 items
BANDAGE ESMARK 6X9 LF (GAUZE/BANDAGES/DRESSINGS) IMPLANT
BIT DRILL 3.0 6IN (DRILL) ×1 IMPLANT
BLADE SAW SGTL HD 18.5X60.5X1. (BLADE) ×2 IMPLANT
BLADE SAW SGTL NAR THIN XSHT (BLADE) ×2 IMPLANT
BLADE SURG 10 STRL SS (BLADE) IMPLANT
BNDG COHESIVE 4X5 TAN STRL (GAUZE/BANDAGES/DRESSINGS) IMPLANT
BNDG COHESIVE 6X5 TAN NS LF (GAUZE/BANDAGES/DRESSINGS) ×2 IMPLANT
BNDG ESMARK 6X9 LF (GAUZE/BANDAGES/DRESSINGS)
BNDG GAUZE ELAST 4 BULKY (GAUZE/BANDAGES/DRESSINGS) ×2 IMPLANT
COTTON STERILE ROLL (GAUZE/BANDAGES/DRESSINGS) IMPLANT
COVER MAYO STAND STRL (DRAPES) IMPLANT
COVER SURGICAL LIGHT HANDLE (MISCELLANEOUS) ×4 IMPLANT
COVER WAND RF STERILE (DRAPES) IMPLANT
DRAPE INCISE IOBAN 66X45 STRL (DRAPES) ×2 IMPLANT
DRAPE OEC MINIVIEW 54X84 (DRAPES) IMPLANT
DRAPE U-SHAPE 47X51 STRL (DRAPES) ×2 IMPLANT
DRILL 3.0 6IN (DRILL) ×2
DRSG ADAPTIC 3X8 NADH LF (GAUZE/BANDAGES/DRESSINGS) ×2 IMPLANT
DRSG PAD ABDOMINAL 8X10 ST (GAUZE/BANDAGES/DRESSINGS) ×2 IMPLANT
DURAPREP 26ML APPLICATOR (WOUND CARE) ×2 IMPLANT
ELECT REM PT RETURN 9FT ADLT (ELECTROSURGICAL) ×2
ELECTRODE REM PT RTRN 9FT ADLT (ELECTROSURGICAL) ×1 IMPLANT
GAUZE SPONGE 4X4 12PLY STRL (GAUZE/BANDAGES/DRESSINGS) ×2 IMPLANT
GLOVE BIOGEL PI IND STRL 9 (GLOVE) ×1 IMPLANT
GLOVE BIOGEL PI INDICATOR 9 (GLOVE) ×1
GLOVE SURG ORTHO 9.0 STRL STRW (GLOVE) ×2 IMPLANT
GOWN STRL REUS W/ TWL XL LVL3 (GOWN DISPOSABLE) ×3 IMPLANT
GOWN STRL REUS W/TWL XL LVL3 (GOWN DISPOSABLE) ×6
GUIDEWIRE 1.6 6IN (WIRE) ×4 IMPLANT
KIT BASIN OR (CUSTOM PROCEDURE TRAY) ×2 IMPLANT
KIT STAPLE ARCUS 20X17 STRL (Staple) ×2 IMPLANT
KIT TURNOVER KIT B (KITS) ×2 IMPLANT
MANIFOLD NEPTUNE II (INSTRUMENTS) ×2 IMPLANT
NS IRRIG 1000ML POUR BTL (IV SOLUTION) ×2 IMPLANT
PACK ORTHO EXTREMITY (CUSTOM PROCEDURE TRAY) ×2 IMPLANT
PAD ARMBOARD 7.5X6 YLW CONV (MISCELLANEOUS) ×4 IMPLANT
PAD CAST 4YDX4 CTTN HI CHSV (CAST SUPPLIES) ×1 IMPLANT
PADDING CAST COTTON 4X4 STRL (CAST SUPPLIES) ×2
PADDING CAST COTTON 6X4 STRL (CAST SUPPLIES) ×2 IMPLANT
SCREW CANN SHT HDLS 4X40 (Screw) ×2 IMPLANT
SCREW CANN SHT HDLS 4X44 (Screw) ×2 IMPLANT
SPONGE LAP 18X18 RF (DISPOSABLE) IMPLANT
SUCTION FRAZIER HANDLE 10FR (MISCELLANEOUS) ×2
SUCTION TUBE FRAZIER 10FR DISP (MISCELLANEOUS) ×1 IMPLANT
SUT ETHILON 2 0 PSLX (SUTURE) IMPLANT
TOWEL GREEN STERILE (TOWEL DISPOSABLE) ×2 IMPLANT
TOWEL GREEN STERILE FF (TOWEL DISPOSABLE) ×2 IMPLANT
TUBE CONNECTING 12X1/4 (SUCTIONS) ×2 IMPLANT
WATER STERILE IRR 1000ML POUR (IV SOLUTION) IMPLANT

## 2020-12-14 NOTE — Interval H&P Note (Signed)
History and Physical Interval Note:  12/14/2020 7:24 AM  Austin Bryant  has presented today for surgery, with the diagnosis of Lisfranc Fracture Right Foot.  The various methods of treatment have been discussed with the patient and family. After consideration of risks, benefits and other options for treatment, the patient has consented to  Procedure(s): OPEN REDUCTION INTERNAL FIXATION RIGHT FOOT (Right) as a surgical intervention.  The patient's history has been reviewed, patient examined, no change in status, stable for surgery.  I have reviewed the patient's chart and labs.  Questions were answered to the patient's satisfaction.     Nadara Mustard

## 2020-12-14 NOTE — Anesthesia Preprocedure Evaluation (Signed)
Anesthesia Evaluation  Patient identified by MRN, date of birth, ID band Patient awake    Reviewed: Allergy & Precautions, NPO status , Patient's Chart, lab work & pertinent test results  Airway Mallampati: II  TM Distance: >3 FB Neck ROM: Full    Dental  (+) Teeth Intact, Dental Advisory Given   Pulmonary Current Smoker,    breath sounds clear to auscultation       Cardiovascular  Rhythm:Regular Rate:Normal     Neuro/Psych    GI/Hepatic   Endo/Other    Renal/GU      Musculoskeletal   Abdominal   Peds  Hematology   Anesthesia Other Findings   Reproductive/Obstetrics                             Anesthesia Physical Anesthesia Plan  ASA: II  Anesthesia Plan: General   Post-op Pain Management:  Regional for Post-op pain   Induction: Intravenous  PONV Risk Score and Plan: Ondansetron and Dexamethasone  Airway Management Planned: LMA  Additional Equipment:   Intra-op Plan:   Post-operative Plan: Extubation in OR  Informed Consent: I have reviewed the patients History and Physical, chart, labs and discussed the procedure including the risks, benefits and alternatives for the proposed anesthesia with the patient or authorized representative who has indicated his/her understanding and acceptance.     Dental advisory given  Plan Discussed with: Anesthesiologist and CRNA  Anesthesia Plan Comments:         Anesthesia Quick Evaluation
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
## 2020-12-14 NOTE — Op Note (Signed)
12/14/2020  10:35 AM  PATIENT:  Austin Bryant    PRE-OPERATIVE DIAGNOSIS:  Lisfranc Fracture Right Foot  POST-OPERATIVE DIAGNOSIS:  Same  PROCEDURE:  OPEN REDUCTION INTERNAL FIXATION RIGHT FOOT  SURGEON:  Nadara Mustard, MD  PHYSICIAN ASSISTANT:None ANESTHESIA:   General  PREOPERATIVE INDICATIONS:  Austin Bryant is a  35 y.o. male with a diagnosis of Lisfranc Fracture Right Foot who failed conservative measures and elected for surgical management.    The risks benefits and alternatives were discussed with the patient preoperatively including but not limited to the risks of infection, bleeding, nerve injury, cardiopulmonary complications, the need for revision surgery, among others, and the patient was willing to proceed.  OPERATIVE IMPLANTS: 4.5 mm headless cannulated compression screws x2 with a compression staple.  @ENCIMAGES @  OPERATIVE FINDINGS: C-arm fluoroscopy verified reduction across the Lisfranc fracture dislocation.  OPERATIVE PROCEDURE: Patient was brought to the operating room and underwent a general anesthetic.  After adequate levels anesthesia were obtained patient's right lower extremity was prepped using DuraPrep draped into a sterile field a timeout was called.  A dorsal incision was made through the skin over the first webspace.  Blunt dissection was carried down to the EHL tendon and the fascia was released the tendon was retracted medially and the dissection was carried down  subperiosteal beneath the EHL tendon.  The great toe base of first metatarsal medial cuneiform was completely unstable with complete disruption of the ligamentous structure.  Due to the comminution at the base of the second metatarsal it was elected proceed with a fusion due to the instability and fracture across the Lisfranc complex.  An oscillating saw was used to debride the articular cartilage from the medial cuneiform and base of the first metatarsal the remaining bone was removed with a  curette this joint was irrigated reduced and stabilized with a K wire.  C-arm fluoroscopy verified alignment this was then stabilized with a 40 mm partially-threaded 4.5 headless compression screw.  The Lisfranc joint was further debrided the base of the second metatarsal was reduced a guidewire was inserted from the medial cuneiform to the base of the second metatarsal C-arm fluoroscopy verified alignment and a 44 mm headless cannulated screw was used to stabilize Lisfranc complex.  The medial and middle cuneiform ligamentous complex was also unstable and a staple 20 x 17 mm was placed across this joint to stabilize the medial and middle cuneiform.  See arthroscopy verified reduction of the joints the wound was irrigated with normal saline incision closed using 2-0 nylon a sterile dressing was applied patient was extubated anesthesia proceeded with a regional block they were unable to proceed with a regional block preoperatively in the holding area.  Patient was then taken the PACU in stable condition.   DISCHARGE PLANNING:  Antibiotic duration: Preoperative antibiotics  Weightbearing: Nonweightbearing on the right  Pain medication: Prescription for Percocet  Dressing care/ Wound VAC: Dry dressing.  Ambulatory devices: Crutches  Discharge to: Anticipate follow-up in the office in 1 week to change the dressing.  Follow-up: In the office 1 week post operative.

## 2020-12-14 NOTE — Anesthesia Procedure Notes (Signed)
Procedure Name: Intubation Date/Time: 12/14/2020 9:10 AM Performed by: Trinna Post., CRNA Pre-anesthesia Checklist: Patient identified, Emergency Drugs available, Suction available, Patient being monitored and Timeout performed Patient Re-evaluated:Patient Re-evaluated prior to induction Oxygen Delivery Method: Circle system utilized Preoxygenation: Pre-oxygenation with 100% oxygen Induction Type: IV induction Ventilation: Mask ventilation without difficulty Laryngoscope Size: Mac and 4 Grade View: Grade I Tube type: Oral Tube size: 7.0 mm Number of attempts: 1 Airway Equipment and Method: Stylet Placement Confirmation: ETT inserted through vocal cords under direct vision,  positive ETCO2 and breath sounds checked- equal and bilateral Secured at: 21 cm Tube secured with: Tape Dental Injury: Teeth and Oropharynx as per pre-operative assessment

## 2020-12-14 NOTE — Anesthesia Procedure Notes (Signed)
Anesthesia Regional Block: Adductor canal block   Pre-Anesthetic Checklist: ,, timeout performed, Correct Patient, Correct Site, Correct Laterality, Correct Procedure, Correct Position, site marked, Risks and benefits discussed, pre-op evaluation,  At surgeon's request and post-op pain management  Laterality: Right  Prep: Maximum Sterile Barrier Precautions used, chloraprep       Needles:  Injection technique: Single-shot  Needle Type: Echogenic Stimulator Needle     Needle Length: 9cm  Needle Gauge: 21     Additional Needles:   Procedures:,,,, ultrasound used (permanent image in chart),,,,  Narrative:  Start time: 12/14/2020 10:15 AM End time: 12/14/2020 10:20 AM Injection made incrementally with aspirations every 5 mL.  Performed by: Personally  Anesthesiologist: Kipp Brood, MD  Additional Notes: 20 cc 0.75% Ropivacaine injected

## 2020-12-14 NOTE — Transfer of Care (Signed)
Immediate Anesthesia Transfer of Care Note  Patient: Austin Bryant  Procedure(s) Performed: OPEN REDUCTION INTERNAL FIXATION RIGHT FOOT (Right Foot)  Patient Location: PACU  Anesthesia Type:General and Regional  Level of Consciousness: awake and drowsy  Airway & Oxygen Therapy: Patient Spontanous Breathing and Patient connected to face mask oxygen  Post-op Assessment: Report given to RN and Post -op Vital signs reviewed and stable  Post vital signs: Reviewed and stable  Last Vitals:  Vitals Value Taken Time  BP 125/79 12/14/20 1030  Temp    Pulse 72 12/14/20 1034  Resp 23 12/14/20 1034  SpO2 100 % 12/14/20 1034  Vitals shown include unvalidated device data.  Last Pain:  Vitals:   12/14/20 0748  TempSrc:   PainSc: 10-Worst pain ever         Complications: No complications documented.

## 2020-12-14 NOTE — Anesthesia Postprocedure Evaluation (Signed)
Anesthesia Post Note  Patient: Austin Bryant  Procedure(s) Performed: OPEN REDUCTION INTERNAL FIXATION RIGHT FOOT (Right Foot)     Patient location during evaluation: PACU Anesthesia Type: Regional Level of consciousness: awake and alert Pain management: pain level controlled Vital Signs Assessment: post-procedure vital signs reviewed and stable Respiratory status: spontaneous breathing, nonlabored ventilation, respiratory function stable and patient connected to nasal cannula oxygen Cardiovascular status: blood pressure returned to baseline and stable Postop Assessment: no apparent nausea or vomiting Anesthetic complications: no   No complications documented.  Last Vitals:  Vitals:   12/14/20 1300 12/14/20 1315  BP: 106/85 105/71  Pulse: 60 75  Resp: 16 19  Temp:  (!) 36.4 C  SpO2: 97% 97%    Last Pain:  Vitals:   12/14/20 1315  TempSrc:   PainSc: 0-No pain                 Khyler Urda COKER

## 2020-12-14 NOTE — Anesthesia Procedure Notes (Signed)
Anesthesia Regional Block: Popliteal block   Pre-Anesthetic Checklist: ,, timeout performed, Correct Patient, Correct Site, Correct Laterality, Correct Procedure, Correct Position, site marked, Risks and benefits discussed,  Surgical consent,  Pre-op evaluation,  At surgeon's request and post-op pain management  Laterality: Right  Prep: chloraprep       Needles:  Injection technique: Single-shot  Needle Type: Stimulator Needle - 40      Needle Gauge: 22     Additional Needles:   Procedures:, nerve stimulator,,,,,,,  Narrative:  Start time: 12/14/2020 10:05 AM End time: 12/14/2020 10:10 AM Injection made incrementally with aspirations every 5 mL.  Performed by: Personally  Anesthesiologist: Kipp Brood, MD  Additional Notes: 25 cc 0.5% Bupivacaine 1:200 Epi

## 2020-12-15 ENCOUNTER — Encounter (HOSPITAL_COMMUNITY): Payer: Self-pay | Admitting: Orthopedic Surgery

## 2020-12-16 ENCOUNTER — Encounter: Payer: Self-pay | Admitting: Physician Assistant

## 2020-12-19 ENCOUNTER — Other Ambulatory Visit: Payer: Self-pay | Admitting: Physician Assistant

## 2020-12-19 ENCOUNTER — Telehealth: Payer: Self-pay | Admitting: Physician Assistant

## 2020-12-19 MED ORDER — OXYCODONE-ACETAMINOPHEN 5-325 MG PO TABS: 1.0000 | ORAL_TABLET | ORAL | 0 refills | Status: DC | PRN

## 2020-12-19 NOTE — Telephone Encounter (Signed)
Patient called requesting a refill of oxycodone. Please send to pharmacy on file. Patient phone number is [phone].

## 2020-12-19 NOTE — Telephone Encounter (Signed)
done

## 2020-12-19 NOTE — Telephone Encounter (Signed)
Please advise 

## 2020-12-23 ENCOUNTER — Ambulatory Visit (INDEPENDENT_AMBULATORY_CARE_PROVIDER_SITE_OTHER): Payer: Self-pay

## 2020-12-23 ENCOUNTER — Ambulatory Visit (INDEPENDENT_AMBULATORY_CARE_PROVIDER_SITE_OTHER): Payer: Self-pay | Admitting: Family

## 2020-12-23 ENCOUNTER — Encounter: Payer: Self-pay | Admitting: Family

## 2020-12-23 DIAGNOSIS — S93324D Dislocation of tarsometatarsal joint of right foot, subsequent encounter: Secondary | ICD-10-CM

## 2020-12-23 MED ORDER — OXYCODONE-ACETAMINOPHEN 5-325 MG PO TABS: 1.0000 | ORAL_TABLET | Freq: Four times a day (QID) | ORAL | 0 refills | Status: DC | PRN
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
## 2020-12-30 ENCOUNTER — Telehealth: Payer: Self-pay | Admitting: Physician Assistant

## 2020-12-30 ENCOUNTER — Other Ambulatory Visit: Payer: Self-pay | Admitting: Physician Assistant

## 2020-12-30 MED ORDER — OXYCODONE-ACETAMINOPHEN 5-325 MG PO TABS: 1.0000 | ORAL_TABLET | Freq: Four times a day (QID) | ORAL | 0 refills | Status: DC | PRN

## 2020-12-30 NOTE — Telephone Encounter (Signed)
Patient called requesting a refill of oxycodone. Please send to pharmacy on file. Please call patient at [phone].

## 2020-12-30 NOTE — Telephone Encounter (Signed)
12/14/2020 ORIF right foot please see below.

## 2020-12-30 NOTE — Telephone Encounter (Signed)
done

## 2021-01-06 ENCOUNTER — Encounter: Payer: Self-pay | Admitting: Physician Assistant

## 2021-01-09 ENCOUNTER — Telehealth: Payer: Self-pay | Admitting: Physician Assistant

## 2021-01-09 ENCOUNTER — Other Ambulatory Visit: Payer: Self-pay | Admitting: Physician Assistant

## 2021-01-09 MED ORDER — OXYCODONE-ACETAMINOPHEN 5-325 MG PO TABS: 1.0000 | ORAL_TABLET | Freq: Four times a day (QID) | ORAL | 0 refills | Status: DC | PRN

## 2021-01-09 NOTE — Telephone Encounter (Signed)
done

## 2021-01-09 NOTE — Telephone Encounter (Signed)
Patient called. He would like a refill on oxycodone. His call back number is [phone]

## 2021-01-09 NOTE — Telephone Encounter (Signed)
Pt is s/p orif right foot 12/14/20 please see below.

## 2021-01-10 ENCOUNTER — Encounter: Payer: Self-pay | Admitting: Orthopedic Surgery

## 2021-01-10 ENCOUNTER — Ambulatory Visit (INDEPENDENT_AMBULATORY_CARE_PROVIDER_SITE_OTHER): Payer: Self-pay

## 2021-01-10 ENCOUNTER — Ambulatory Visit (INDEPENDENT_AMBULATORY_CARE_PROVIDER_SITE_OTHER): Payer: Self-pay | Admitting: Orthopedic Surgery

## 2021-01-10 DIAGNOSIS — S93324D Dislocation of tarsometatarsal joint of right foot, subsequent encounter: Secondary | ICD-10-CM

## 2021-01-10 MED ORDER — OXYCODONE-ACETAMINOPHEN 5-325 MG PO TABS: 1.0000 | ORAL_TABLET | Freq: Four times a day (QID) | ORAL | 0 refills | Status: AC | PRN
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
## 2021-01-17 ENCOUNTER — Encounter: Payer: Self-pay | Admitting: Orthopedic Surgery

## 2021-01-17 ENCOUNTER — Ambulatory Visit (INDEPENDENT_AMBULATORY_CARE_PROVIDER_SITE_OTHER): Payer: Self-pay | Admitting: Orthopedic Surgery

## 2021-01-17 ENCOUNTER — Telehealth: Payer: Self-pay | Admitting: Orthopedic Surgery

## 2021-01-17 ENCOUNTER — Ambulatory Visit: Payer: Self-pay

## 2021-01-17 DIAGNOSIS — M79671 Pain in right foot: Secondary | ICD-10-CM

## 2021-01-17 DIAGNOSIS — S93324D Dislocation of tarsometatarsal joint of right foot, subsequent encounter: Secondary | ICD-10-CM

## 2021-01-17 MED ORDER — CELECOXIB 200 MG PO CAPS: 200.0000 mg | ORAL_CAPSULE | Freq: Two times a day (BID) | ORAL | 3 refills | Status: DC

## 2021-01-17 MED ORDER — GABAPENTIN 300 MG PO CAPS: 300.0000 mg | ORAL_CAPSULE | Freq: Three times a day (TID) | ORAL | 3 refills | Status: DC

## 2021-01-17 MED ORDER — OXYCODONE-ACETAMINOPHEN 5-325 MG PO TABS: 1.0000 | ORAL_TABLET | Freq: Every day | ORAL | 0 refills | Status: AC | PRN

## 2021-01-17 NOTE — Telephone Encounter (Signed)
Patient called needing Rx refilled Oxycodone 5 mg. The number to contact patient is [phone]
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
## 2021-01-17 NOTE — Telephone Encounter (Signed)
12/14/20 ORIF right foot. Requesting refill on Oxycodone 5/325 last refill was 01/10/21 # 30 has had #150 in the past 4 weeks. Please advise.

## 2021-01-17 NOTE — Telephone Encounter (Signed)
I called pt and advised of message below. He states that he is in terrible pain and does not know what to do. Appt made for today at 3:30 for eval.

## 2021-01-26 ENCOUNTER — Telehealth: Payer: Self-pay | Admitting: Orthopedic Surgery

## 2021-01-26 ENCOUNTER — Telehealth: Payer: Self-pay

## 2021-01-26 NOTE — Telephone Encounter (Signed)
Patient would like a letter/note stating that he is able to return back to work.  CB# [phone]. Please advise.  Thank you

## 2021-01-26 NOTE — Telephone Encounter (Signed)
Called and lm on vm to advise of message below. To call with any questions.  

## 2021-01-26 NOTE — Telephone Encounter (Signed)
Per dr. Audrie Lia note patient was given 10 percocet at last visit and was to wean off of them.

## 2021-01-26 NOTE — Telephone Encounter (Signed)
I called and lm on vm for pt to advise that we should refrain on a return to work note right now as of his last office visit Dr. Lajoyce Corners had wanted him to minimize his weight bearing and use his crutches. He has a follow up appt sch for Tuesday and advised we should discuss his return to work plan at that time. To call with nay questions.

## 2021-01-26 NOTE — Telephone Encounter (Signed)
ORIF right foor 12/14/2020 please see request below for pain medication

## 2021-01-26 NOTE — Telephone Encounter (Signed)
Refill on oxycodone

## 2021-01-27 ENCOUNTER — Other Ambulatory Visit: Payer: Self-pay

## 2021-01-27 ENCOUNTER — Telehealth: Payer: Self-pay | Admitting: Orthopedic Surgery

## 2021-01-27 MED ORDER — CELECOXIB 200 MG PO CAPS: 200.0000 mg | ORAL_CAPSULE | Freq: Two times a day (BID) | ORAL | 3 refills | Status: AC

## 2021-01-27 MED ORDER — GABAPENTIN 300 MG PO CAPS: 300.0000 mg | ORAL_CAPSULE | Freq: Three times a day (TID) | ORAL | 3 refills | Status: AC

## 2021-01-27 NOTE — Telephone Encounter (Signed)
Have tried to call pharmacy to ask what the out of pocket expense is for these medications. Pharmacy is currently closed will hold and try again.

## 2021-01-27 NOTE — Telephone Encounter (Signed)
Patient called advised the Gabapentin and Celebrex cost $300.00 and he can not afford the medication. Patient asked for a call back to discuss what he should do. Patient said his toes are numb on his right and can not work with his foot like this. The number to contact patient is [phone]

## 2021-01-27 NOTE — Telephone Encounter (Signed)
Tried to call pharm again and message states unable to connect call at this time. Will hold and try again later.

## 2021-01-27 NOTE — Telephone Encounter (Signed)
I called the pt and he is self pay. I advised that I looked on line and through [facility] he can get  his Celebrex for 13.68 and Neurontin for 10.23 at [facility]. I registered the pt on line and had the coupon text to his phone. He can go to the pharm and have this medication filled. He asked that I send it to the [facility] in [place]. This has been done. He has a follow up appt on 03/02/21 will re-eval and see if the medication was helpful

## 2021-01-31 ENCOUNTER — Encounter: Payer: Self-pay | Admitting: Orthopedic Surgery

## 2021-01-31 ENCOUNTER — Telehealth: Payer: Self-pay

## 2021-01-31 NOTE — Telephone Encounter (Signed)
Patient called he is requesting a detailed work note for his employer so he can return back to work, patient is requesting the note to be ready tomorrow call back:[phone]

## 2021-01-31 NOTE — Telephone Encounter (Signed)
**  Patient is also requesting a call back from Dr.Duda specifically.

## 2021-02-01 NOTE — Telephone Encounter (Signed)
At front desk for pick up

## 2021-02-13 ENCOUNTER — Encounter: Payer: Self-pay | Admitting: Physician Assistant

## 2021-02-13 ENCOUNTER — Other Ambulatory Visit: Payer: Self-pay | Admitting: Physician Assistant

## 2021-02-16 ENCOUNTER — Telehealth: Payer: Self-pay | Admitting: Radiology

## 2021-02-16 ENCOUNTER — Encounter: Payer: Self-pay | Admitting: Physician Assistant

## 2021-02-16 ENCOUNTER — Ambulatory Visit: Payer: Self-pay | Admitting: Orthopaedic Surgery

## 2021-02-16 NOTE — Telephone Encounter (Signed)
I called patient NA, cannot leave message, VM full.   Need to discuss with him reason for visit today.  He is under post op care of Dr Lajoyce Corners, and needs to see Dr Lajoyce Corners for his right foot.   Please let me know if patient calls back. I need to speak with him.

## 2021-02-17 ENCOUNTER — Encounter: Payer: Self-pay | Admitting: Physician Assistant

## 2021-05-09 ENCOUNTER — Telehealth: Payer: Self-pay | Admitting: Orthopedic Surgery

## 2021-05-09 NOTE — Telephone Encounter (Signed)
Pt called stating his foot that he had surg on previously is swelling and has a bit of pain. Got an appt sch'd for Monday to see Lajoyce Corners but did not know if he needed to see someone sooner. Surg appt was 5/22 with Lajoyce Corners. The best call back number is [phone].

## 2021-05-09 NOTE — Telephone Encounter (Signed)
We can make an appt for him to see [facility] tomorrow and I can open a time if you need?

## 2021-05-12 ENCOUNTER — Ambulatory Visit: Payer: Self-pay | Admitting: Family

## 2021-05-15 ENCOUNTER — Ambulatory Visit: Payer: Self-pay | Admitting: Orthopedic Surgery

## 2021-07-07 ENCOUNTER — Encounter (HOSPITAL_COMMUNITY): Payer: Self-pay | Admitting: *Deleted

## 2021-07-07 ENCOUNTER — Emergency Department (HOSPITAL_COMMUNITY)
Admission: EM | Admit: 2021-07-07 | Discharge: 2021-07-07 | Disposition: A | Discharge status: Home / Self Care | Payer: Self-pay | Attending: Emergency Medicine | Admitting: Emergency Medicine

## 2021-07-07 DIAGNOSIS — F17210 Nicotine dependence, cigarettes, uncomplicated: Secondary | ICD-10-CM | POA: Insufficient documentation

## 2021-07-07 DIAGNOSIS — L03114 Cellulitis of left upper limb: Secondary | ICD-10-CM | POA: Insufficient documentation

## 2021-07-07 MED ORDER — SULFAMETHOXAZOLE-TRIMETHOPRIM 800-160 MG PO TABS: 1.0000 | ORAL_TABLET | Freq: Two times a day (BID) | ORAL | 0 refills | Status: AC

## 2021-07-07 NOTE — Discharge Instructions (Addendum)
Please take Bactrim which is an antibiotic for the next 5 days twice per day.  600 mg of ibuprofen every 6 hours for pain.  Please return to the emergency department if you experience worsening pain, fevers, streaking redness up the arm, increased drainage, or any other concerns you might have.
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
## 2021-07-07 NOTE — ED Triage Notes (Signed)
Left arm pain after hunting yesterday

## 2021-07-25 ENCOUNTER — Ambulatory Visit: Payer: Self-pay | Admitting: Family

## 2021-07-26 ENCOUNTER — Ambulatory Visit: Payer: Self-pay | Admitting: Family

## 2021-12-15 ENCOUNTER — Emergency Department (HOSPITAL_COMMUNITY)
Admission: EM | Admit: 2021-12-15 | Discharge: 2021-12-16 | Disposition: A | Discharge status: Home / Self Care | Payer: Self-pay | Attending: Emergency Medicine | Admitting: Emergency Medicine

## 2021-12-15 ENCOUNTER — Other Ambulatory Visit: Payer: Self-pay

## 2021-12-15 ENCOUNTER — Encounter (HOSPITAL_COMMUNITY): Payer: Self-pay | Admitting: Emergency Medicine

## 2021-12-15 ENCOUNTER — Emergency Department (HOSPITAL_COMMUNITY): Payer: Self-pay

## 2021-12-15 DIAGNOSIS — F1910 Other psychoactive substance abuse, uncomplicated: Secondary | ICD-10-CM

## 2021-12-15 DIAGNOSIS — R4182 Altered mental status, unspecified: Secondary | ICD-10-CM | POA: Insufficient documentation

## 2021-12-15 DIAGNOSIS — R5383 Other fatigue: Secondary | ICD-10-CM | POA: Insufficient documentation

## 2021-12-15 DIAGNOSIS — Z0279 Encounter for issue of other medical certificate: Secondary | ICD-10-CM | POA: Insufficient documentation

## 2021-12-15 LAB — CBC
HCT: 39.1 % (ref 39.0–52.0)
Hemoglobin: 12.7 g/dL — ABNORMAL LOW (ref 13.0–17.0)
MCH: 30.3 pg (ref 26.0–34.0)
MCHC: 32.5 g/dL (ref 30.0–36.0)
MCV: 93.3 fL (ref 80.0–100.0)
Platelets: 230 10*3/uL (ref 150–400)
RBC: 4.19 MIL/uL — ABNORMAL LOW (ref 4.22–5.81)
RDW: 13.3 % (ref 11.5–15.5)
WBC: 7 10*3/uL (ref 4.0–10.5)
nRBC: 0 % (ref 0.0–0.2)

## 2021-12-15 LAB — COMPREHENSIVE METABOLIC PANEL
ALT: 42 U/L (ref 0–44)
AST: 29 U/L (ref 15–41)
Albumin: 4.1 g/dL (ref 3.5–5.0)
Alkaline Phosphatase: 76 U/L (ref 38–126)
Anion gap: 8 (ref 5–15)
BUN: 13 mg/dL (ref 6–20)
CO2: 23 mmol/L (ref 22–32)
Calcium: 8.9 mg/dL (ref 8.9–10.3)
Chloride: 107 mmol/L (ref 98–111)
Creatinine, Ser: 0.7 mg/dL (ref 0.61–1.24)
GFR, Estimated: >60 mL/min (ref >60)
Glucose, Bld: 101 mg/dL — ABNORMAL HIGH (ref 70–99)
Potassium: 3.4 mmol/L — ABNORMAL LOW (ref 3.5–5.1)
Sodium: 138 mmol/L (ref 135–145)
Total Bilirubin: 0.9 mg/dL (ref 0.3–1.2)
Total Protein: 7.7 g/dL (ref 6.5–8.1)

## 2021-12-15 LAB — RAPID URINE DRUG SCREEN, HOSP PERFORMED
Amphetamines: POSITIVE — AB
Barbiturates: NOT DETECTED
Benzodiazepines: POSITIVE — AB
Cocaine: NOT DETECTED
Opiates: NOT DETECTED
Tetrahydrocannabinol: POSITIVE — AB

## 2021-12-15 LAB — ETHANOL: Alcohol, Ethyl (B): <10 mg/dL (ref <10)

## 2021-12-15 MED ORDER — NALOXONE HCL 4 MG/0.1ML NA LIQD
1.0000 | Freq: Once | NASAL | Status: AC
Start: 2021-12-15 — End: 2021-12-15
  Administered 2021-12-15: 1 via NASAL
  Filled 2021-12-15: qty 4

## 2021-12-15 MED ORDER — NALOXONE HCL 4 MG/0.1ML NA LIQD
1.0000 | Freq: Once | NASAL | Status: AC
  Administered 2021-12-15: 1 via NASAL
  Filled 2021-12-15: qty 4
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
## 2021-12-15 NOTE — ED Notes (Signed)
Pt pupils pinpoint. Able to arouse. Pt alert and oriented  ?

## 2021-12-15 NOTE — ED Notes (Signed)
Blood work collected by lab 
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
## 2021-12-15 NOTE — ED Notes (Signed)
Multiple failed IV attempts

## 2021-12-16 NOTE — ED Notes (Signed)
Pt alert. Unassisted to wheelchair. Discharged with [employer]  ?
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
## 2022-10-04 ENCOUNTER — Encounter: Payer: Self-pay | Admitting: Radiology

## 2023-05-17 ENCOUNTER — Telehealth (HOSPITAL_COMMUNITY): Payer: Self-pay

## 2023-05-17 ENCOUNTER — Encounter (HOSPITAL_COMMUNITY): Payer: Self-pay

## 2023-10-10 ENCOUNTER — Other Ambulatory Visit: Payer: Self-pay

## 2023-10-10 ENCOUNTER — Emergency Department (HOSPITAL_COMMUNITY)
Admission: EM | Admit: 2023-10-10 | Discharge: 2023-10-10 | Discharge status: Against Medical Advice | Payer: MEDICAID | Attending: Emergency Medicine | Admitting: Emergency Medicine

## 2023-10-10 ENCOUNTER — Encounter (HOSPITAL_COMMUNITY): Payer: Self-pay

## 2023-10-10 DIAGNOSIS — R197 Diarrhea, unspecified: Secondary | ICD-10-CM | POA: Insufficient documentation

## 2023-10-10 DIAGNOSIS — M545 Low back pain, unspecified: Secondary | ICD-10-CM | POA: Diagnosis not present

## 2023-10-10 DIAGNOSIS — R112 Nausea with vomiting, unspecified: Secondary | ICD-10-CM | POA: Insufficient documentation

## 2023-10-10 DIAGNOSIS — Z5321 Procedure and treatment not carried out due to patient leaving prior to being seen by health care provider: Secondary | ICD-10-CM | POA: Diagnosis not present

## 2023-10-10 LAB — CBC WITH DIFFERENTIAL/PLATELET
Abs Immature Granulocytes: 0.02 10*3/uL (ref 0.00–0.07)
Basophils Absolute: 0 10*3/uL (ref 0.0–0.1)
Basophils Relative: 0 %
Eosinophils Absolute: 0.1 10*3/uL (ref 0.0–0.5)
Eosinophils Relative: 2 %
HCT: 45.1 % (ref 39.0–52.0)
Hemoglobin: 14.8 g/dL (ref 13.0–17.0)
Immature Granulocytes: 0 %
Lymphocytes Relative: 32 %
Lymphs Abs: 2.4 10*3/uL (ref 0.7–4.0)
MCH: 31.4 pg (ref 26.0–34.0)
MCHC: 32.8 g/dL (ref 30.0–36.0)
MCV: 95.8 fL (ref 80.0–100.0)
Monocytes Absolute: 0.4 10*3/uL (ref 0.1–1.0)
Monocytes Relative: 6 %
Neutro Abs: 4.4 10*3/uL (ref 1.7–7.7)
Neutrophils Relative %: 60 %
Platelets: 265 10*3/uL (ref 150–400)
RBC: 4.71 MIL/uL (ref 4.22–5.81)
RDW: 11.9 % (ref 11.5–15.5)
WBC: 7.3 10*3/uL (ref 4.0–10.5)
nRBC: 0 % (ref 0.0–0.2)

## 2023-10-10 LAB — COMPREHENSIVE METABOLIC PANEL
ALT: 58 U/L — ABNORMAL HIGH (ref 0–44)
AST: 41 U/L (ref 15–41)
Albumin: 3.8 g/dL (ref 3.5–5.0)
Alkaline Phosphatase: 86 U/L (ref 38–126)
Anion gap: 7 (ref 5–15)
BUN: 13 mg/dL (ref 6–20)
CO2: 28 mmol/L (ref 22–32)
Calcium: 9.1 mg/dL (ref 8.9–10.3)
Chloride: 99 mmol/L (ref 98–111)
Creatinine, Ser: 0.73 mg/dL (ref 0.61–1.24)
GFR, Estimated: >60 mL/min (ref >60)
Glucose, Bld: 127 mg/dL — ABNORMAL HIGH (ref 70–99)
Potassium: 3.2 mmol/L — ABNORMAL LOW (ref 3.5–5.1)
Sodium: 134 mmol/L — ABNORMAL LOW (ref 135–145)
Total Bilirubin: 0.5 mg/dL (ref 0.0–1.2)
Total Protein: 7.5 g/dL (ref 6.5–8.1)

## 2023-10-10 NOTE — ED Notes (Signed)
 Pt called for X 3. No answer.

## 2023-10-10 NOTE — ED Triage Notes (Signed)
 Pt arrived via POV from home, c/o N/V/D X 3 days. Pt endorses low back pain.

## 2023-10-17 ENCOUNTER — Emergency Department (HOSPITAL_COMMUNITY): Payer: MEDICAID

## 2023-10-17 ENCOUNTER — Emergency Department (HOSPITAL_COMMUNITY)
Admission: EM | Admit: 2023-10-17 | Discharge: 2023-10-18 | Disposition: A | Discharge status: Home / Self Care | Payer: MEDICAID

## 2023-10-17 ENCOUNTER — Encounter (HOSPITAL_COMMUNITY): Payer: Self-pay

## 2023-10-17 ENCOUNTER — Other Ambulatory Visit: Payer: Self-pay

## 2023-10-17 DIAGNOSIS — N390 Urinary tract infection, site not specified: Secondary | ICD-10-CM | POA: Diagnosis not present

## 2023-10-17 DIAGNOSIS — M545 Low back pain, unspecified: Secondary | ICD-10-CM | POA: Diagnosis present

## 2023-10-17 LAB — CBC WITH DIFFERENTIAL/PLATELET
Abs Immature Granulocytes: 0.02 10*3/uL (ref 0.00–0.07)
Basophils Absolute: 0 10*3/uL (ref 0.0–0.1)
Basophils Relative: 0 %
Eosinophils Absolute: 0.1 10*3/uL (ref 0.0–0.5)
Eosinophils Relative: 2 %
HCT: 38.6 % — ABNORMAL LOW (ref 39.0–52.0)
Hemoglobin: 12.7 g/dL — ABNORMAL LOW (ref 13.0–17.0)
Immature Granulocytes: 0 %
Lymphocytes Relative: 28 %
Lymphs Abs: 2 10*3/uL (ref 0.7–4.0)
MCH: 31.8 pg (ref 26.0–34.0)
MCHC: 32.9 g/dL (ref 30.0–36.0)
MCV: 96.7 fL (ref 80.0–100.0)
Monocytes Absolute: 0.4 10*3/uL (ref 0.1–1.0)
Monocytes Relative: 5 %
Neutro Abs: 4.6 10*3/uL (ref 1.7–7.7)
Neutrophils Relative %: 65 %
Platelets: 234 10*3/uL (ref 150–400)
RBC: 3.99 MIL/uL — ABNORMAL LOW (ref 4.22–5.81)
RDW: 12 % (ref 11.5–15.5)
WBC: 7.1 10*3/uL (ref 4.0–10.5)
nRBC: 0 % (ref 0.0–0.2)

## 2023-10-17 LAB — COMPREHENSIVE METABOLIC PANEL
ALT: 48 U/L — ABNORMAL HIGH (ref 0–44)
AST: 37 U/L (ref 15–41)
Albumin: 3.7 g/dL (ref 3.5–5.0)
Alkaline Phosphatase: 81 U/L (ref 38–126)
Anion gap: 8 (ref 5–15)
BUN: 12 mg/dL (ref 6–20)
CO2: 25 mmol/L (ref 22–32)
Calcium: 8.9 mg/dL (ref 8.9–10.3)
Chloride: 103 mmol/L (ref 98–111)
Creatinine, Ser: 0.69 mg/dL (ref 0.61–1.24)
GFR, Estimated: >60 mL/min (ref >60)
Glucose, Bld: 151 mg/dL — ABNORMAL HIGH (ref 70–99)
Potassium: 3.7 mmol/L (ref 3.5–5.1)
Sodium: 136 mmol/L (ref 135–145)
Total Bilirubin: 0.4 mg/dL (ref 0.0–1.2)
Total Protein: 7.2 g/dL (ref 6.5–8.1)

## 2023-10-17 LAB — URINALYSIS, ROUTINE W REFLEX MICROSCOPIC
Bilirubin Urine: NEGATIVE
Glucose, UA: NEGATIVE mg/dL
Hgb urine dipstick: NEGATIVE
Ketones, ur: NEGATIVE mg/dL
Leukocytes,Ua: NEGATIVE
Nitrite: NEGATIVE
Protein, ur: 30 mg/dL — AB
Specific Gravity, Urine: 1.027 (ref 1.005–1.030)
pH: 5 (ref 5.0–8.0)

## 2023-10-17 LAB — LIPASE, BLOOD: Lipase: 25 U/L (ref 11–51)

## 2023-10-17 LAB — LACTIC ACID, PLASMA: Lactic Acid, Venous: 0.9 mmol/L (ref 0.5–1.9)

## 2023-10-17 MED ORDER — ONDANSETRON HCL 4 MG/2ML IJ SOLN
4.0000 mg | Freq: Once | INTRAMUSCULAR | Status: AC
  Administered 2023-10-17: 4 mg via INTRAVENOUS
  Filled 2023-10-17: qty 2

## 2023-10-17 MED ORDER — LACTATED RINGERS IV BOLUS
1000.0000 mL | Freq: Once | INTRAVENOUS | Status: AC
Start: 2023-10-17 — End: 2023-10-17
  Administered 2023-10-17: 1000 mL via INTRAVENOUS

## 2023-10-17 MED ORDER — SODIUM CHLORIDE 0.9 % IV SOLN
1.0000 g | Freq: Once | INTRAVENOUS | Status: AC
  Administered 2023-10-17: 1 g via INTRAVENOUS
  Filled 2023-10-17: qty 10

## 2023-10-17 MED ORDER — GADOBUTROL 1 MMOL/ML IV SOLN
9.0000 mL | Freq: Once | INTRAVENOUS | Status: AC | PRN
Start: 2023-10-17 — End: 2023-10-17
  Administered 2023-10-17: 9 mL via INTRAVENOUS

## 2023-10-17 NOTE — ED Provider Notes (Signed)
 [place] EMERGENCY DEPARTMENT AT [facility] Provider Note   CSN: [id number] Arrival date & time: 10/17/23  1636     History {Add pertinent medical, surgical, social history, OB history to HPI:1} Chief Complaint  Patient presents with   Groin Pain    Austin Bryant is a 38 y.o. male.  38 year old male with past medical history of substance abuse presenting to the emergency department today with pain in his back.  The patient states this been going now for the past week or so.  He states the pain has been getting worse.  He states that he is having difficulty urinating with this.  He states that he is also having some foul-smelling urine.  He states the pain is in the center of his back.  Denies any flank pain with this.  He denies any penile discharge or testicular swelling.  He does report being sexually active with 1 new partner recently.  He denies any specific dysuria.  He reports that he has a remote history of IV drug abuse but denies any recently.  Has had some subjective fevers and chills at home but has not checked his temperature.   Groin Pain       Home Medications Prior to Admission medications   Medication Sig Start Date End Date Taking? Authorizing Provider  celecoxib (CELEBREX) 200 MG capsule Take 1 capsule (200 mg total) by mouth 2 (two) times daily. 01/27/21   Nadara Mustard, MD  gabapentin (NEURONTIN) 300 MG capsule Take 1 capsule (300 mg total) by mouth 3 (three) times daily. 3 times a day when necessary neuropathy pain 01/27/21   Nadara Mustard, MD  oxyCODONE-acetaminophen (PERCOCET/ROXICET) 5-325 MG tablet Take 1 tablet by mouth daily as needed for severe pain. 01/17/21   Nadara Mustard, MD  sulfamethoxazole-trimethoprim (BACTRIM DS) 800-160 MG tablet Take 1 tablet by mouth 2 (two) times daily. 07/07/21   Teressa Lower, PA-C      Allergies    Bee venom, Ibuprofen, Penicillins, and Tape    Review of Systems   Review of Systems  Physical  Exam Updated Vital Signs BP 105/73 (BP Location: Left Arm)   Pulse 69   Temp 98.5 F (36.9 C) (Oral)   Resp 18   Ht 6\' 5"  (1.956 m)   Wt 90.7 kg   SpO2 99%   BMI 23.72 kg/m  Physical Exam  ED Results / Procedures / Treatments   Labs (all labs ordered are listed, but only abnormal results are displayed) Labs Reviewed  URINALYSIS, ROUTINE W REFLEX MICROSCOPIC - Abnormal; Notable for the following components:      Result Value   APPearance HAZY (*)    Protein, ur 30 (*)    Bacteria, UA MANY (*)    All other components within normal limits  CBC WITH DIFFERENTIAL/PLATELET - Abnormal; Notable for the following components:   RBC 3.99 (*)    Hemoglobin 12.7 (*)    HCT 38.6 (*)    All other components within normal limits  COMPREHENSIVE METABOLIC PANEL - Abnormal; Notable for the following components:   Glucose, Bld 151 (*)    ALT 48 (*)    All other components within normal limits  CULTURE, BLOOD (ROUTINE X 2)  CULTURE, BLOOD (ROUTINE X 2)  LIPASE, BLOOD  LACTIC ACID, PLASMA  GC/CHLAMYDIA PROBE AMP ([place]) NOT AT [facility]    EKG EKG Interpretation Date/Time:  Thursday October 17 2023 17:06:41 EDT Ventricular Rate:  121 PR  Interval:  176 QRS Duration:  78 QT Interval:  310 QTC Calculation: 440 R Axis:   96  Text Interpretation: Sinus tachycardia Rightward axis Borderline ECG When compared with ECG of 15-Dec-2021 20:01, PREVIOUS ECG IS PRESENT Confirmed by Beckey Downing [phone]) on 10/17/2023 5:54:45 PM  Radiology No results found.  Procedures Procedures  {Document cardiac monitor, telemetry assessment procedure when appropriate:1}  Medications Ordered in ED Medications  lactated ringers bolus 1,000 mL (0 mLs Intravenous Stopped 10/17/23 2155)  ondansetron (ZOFRAN) injection 4 mg (4 mg Intravenous Given 10/17/23 1947)  gadobutrol (GADAVIST) 1 MMOL/ML injection 9 mL (9 mLs Intravenous Contrast Given 10/17/23 2058)  cefTRIAXone (ROCEPHIN) 1 g in sodium chloride 0.9 % 100  mL IVPB (0 g Intravenous Stopped 10/17/23 2256)    ED Course/ Medical Decision Making/ A&P   {   Click here for ABCD2, HEART and other calculatorsREFRESH Note before signing :1}                              Medical Decision Making Amount and/or Complexity of Data Reviewed Labs: ordered. Radiology: ordered.  Risk Prescription drug management.   ***  {Document critical care time when appropriate:1} {Document review of labs and clinical decision tools ie heart score, Chads2Vasc2 etc:1}  {Document your independent review of radiology images, and any outside records:1} {Document your discussion with family members, caretakers, and with consultants:1} {Document social determinants of health affecting pt's care:1} {Document your decision making why or why not admission, treatments were needed:1} Final Clinical Impression(s) / ED Diagnoses Final diagnoses:  None    Rx / DC Orders ED Discharge Orders     None

## 2023-10-17 NOTE — ED Triage Notes (Signed)
 Pt arrived via POV N/V/D since last week. Pt endorses groin pain and foul smelling urine. Pt reports lower flank pain.

## 2023-10-17 NOTE — ED Notes (Signed)
 Pt transported to MRI. Kellogg RN

## 2023-10-17 NOTE — ED Notes (Signed)
 Pt ambulated to restroom with  steady gait. Kellogg RN

## 2023-10-18 LAB — GC/CHLAMYDIA PROBE AMP (~~LOC~~) NOT AT ARMC
Chlamydia: NEGATIVE
Comment: NEGATIVE
Comment: NORMAL
Neisseria Gonorrhea: NEGATIVE

## 2023-10-18 MED ORDER — CEFPODOXIME PROXETIL 200 MG PO TABS: 200.0000 mg | ORAL_TABLET | Freq: Two times a day (BID) | ORAL | 0 refills | Status: AC

## 2023-10-18 NOTE — Discharge Instructions (Signed)
 Your MRIs did not show any concerning findings.  Your urine does appear to be infected.  Please take the antibiotics as prescribed and follow-up with your doctor.  Return to the ER for worsening symptoms.

## 2023-10-22 LAB — CULTURE, BLOOD (ROUTINE X 2)
Culture: NO GROWTH
Culture: NO GROWTH
Special Requests: ADEQUATE

## 2023-12-04 ENCOUNTER — Encounter (HOSPITAL_COMMUNITY): Payer: Self-pay | Admitting: Emergency Medicine

## 2023-12-04 ENCOUNTER — Emergency Department (HOSPITAL_COMMUNITY): Payer: MEDICAID

## 2023-12-04 ENCOUNTER — Other Ambulatory Visit: Payer: Self-pay

## 2023-12-04 ENCOUNTER — Emergency Department (HOSPITAL_COMMUNITY)
Admission: EM | Admit: 2023-12-04 | Discharge: 2023-12-04 | Disposition: A | Discharge status: Home / Self Care | Payer: MEDICAID | Attending: Emergency Medicine | Admitting: Emergency Medicine

## 2023-12-04 DIAGNOSIS — K529 Noninfective gastroenteritis and colitis, unspecified: Secondary | ICD-10-CM | POA: Diagnosis not present

## 2023-12-04 DIAGNOSIS — F1910 Other psychoactive substance abuse, uncomplicated: Secondary | ICD-10-CM | POA: Diagnosis present

## 2023-12-04 LAB — COMPREHENSIVE METABOLIC PANEL WITH GFR
ALT: 80 U/L — ABNORMAL HIGH (ref 0–44)
AST: 59 U/L — ABNORMAL HIGH (ref 15–41)
Albumin: 3.7 g/dL (ref 3.5–5.0)
Alkaline Phosphatase: 77 U/L (ref 38–126)
Anion gap: 9 (ref 5–15)
BUN: 13 mg/dL (ref 6–20)
CO2: 25 mmol/L (ref 22–32)
Calcium: 9.1 mg/dL (ref 8.9–10.3)
Chloride: 102 mmol/L (ref 98–111)
Creatinine, Ser: 0.62 mg/dL (ref 0.61–1.24)
GFR, Estimated: >60 mL/min (ref >60)
Glucose, Bld: 115 mg/dL — ABNORMAL HIGH (ref 70–99)
Potassium: 3.9 mmol/L (ref 3.5–5.1)
Sodium: 136 mmol/L (ref 135–145)
Total Bilirubin: 0.6 mg/dL (ref 0.0–1.2)
Total Protein: 7 g/dL (ref 6.5–8.1)

## 2023-12-04 LAB — CBC WITH DIFFERENTIAL/PLATELET
Abs Immature Granulocytes: 0.03 10*3/uL (ref 0.00–0.07)
Basophils Absolute: 0 10*3/uL (ref 0.0–0.1)
Basophils Relative: 0 %
Eosinophils Absolute: 0.1 10*3/uL (ref 0.0–0.5)
Eosinophils Relative: 1 %
HCT: 41.3 % (ref 39.0–52.0)
Hemoglobin: 13.6 g/dL (ref 13.0–17.0)
Immature Granulocytes: 0 %
Lymphocytes Relative: 18 %
Lymphs Abs: 1.7 10*3/uL (ref 0.7–4.0)
MCH: 31.6 pg (ref 26.0–34.0)
MCHC: 32.9 g/dL (ref 30.0–36.0)
MCV: 96 fL (ref 80.0–100.0)
Monocytes Absolute: 0.5 10*3/uL (ref 0.1–1.0)
Monocytes Relative: 6 %
Neutro Abs: 7 10*3/uL (ref 1.7–7.7)
Neutrophils Relative %: 75 %
Platelets: 214 10*3/uL (ref 150–400)
RBC: 4.3 MIL/uL (ref 4.22–5.81)
RDW: 12.6 % (ref 11.5–15.5)
WBC: 9.3 10*3/uL (ref 4.0–10.5)
nRBC: 0 % (ref 0.0–0.2)

## 2023-12-04 LAB — RAPID URINE DRUG SCREEN, HOSP PERFORMED
Amphetamines: POSITIVE — AB
Barbiturates: NOT DETECTED
Benzodiazepines: NOT DETECTED
Cocaine: POSITIVE — AB
Opiates: POSITIVE — AB
Tetrahydrocannabinol: POSITIVE — AB

## 2023-12-04 LAB — URINALYSIS, W/ REFLEX TO CULTURE (INFECTION SUSPECTED)
Bilirubin Urine: NEGATIVE
Glucose, UA: NEGATIVE mg/dL
Hgb urine dipstick: NEGATIVE
Ketones, ur: NEGATIVE mg/dL
Leukocytes,Ua: NEGATIVE
Nitrite: NEGATIVE
Protein, ur: 30 mg/dL — AB
Specific Gravity, Urine: 1.02 (ref 1.005–1.030)
pH: 7 (ref 5.0–8.0)

## 2023-12-04 LAB — SEDIMENTATION RATE: Sed Rate: 11 mm/h (ref 0–16)

## 2023-12-04 MED ORDER — METRONIDAZOLE 500 MG PO TABS: 500.0000 mg | ORAL_TABLET | Freq: Two times a day (BID) | ORAL | 0 refills | Status: AC

## 2023-12-04 MED ORDER — ACETAMINOPHEN 325 MG PO TABS
650.0000 mg | ORAL_TABLET | Freq: Once | ORAL | Status: AC
  Administered 2023-12-04: 650 mg via ORAL
  Filled 2023-12-04: qty 2

## 2023-12-04 MED ORDER — GADOBUTROL 1 MMOL/ML IV SOLN
10.0000 mL | Freq: Once | INTRAVENOUS | Status: AC | PRN
  Administered 2023-12-04: 10 mL via INTRAVENOUS

## 2023-12-04 MED ORDER — METHOCARBAMOL 500 MG PO TABS
500.0000 mg | ORAL_TABLET | Freq: Once | ORAL | Status: AC
  Administered 2023-12-04: 500 mg via ORAL
  Filled 2023-12-04: qty 1

## 2023-12-04 MED ORDER — DICYCLOMINE HCL 10 MG PO CAPS
20.0000 mg | ORAL_CAPSULE | Freq: Once | ORAL | Status: AC
  Administered 2023-12-04: 20 mg via ORAL
  Filled 2023-12-04: qty 2

## 2023-12-04 MED ORDER — SODIUM CHLORIDE 0.9 % IV BOLUS
1000.0000 mL | Freq: Once | INTRAVENOUS | Status: AC
  Administered 2023-12-04: 1000 mL via INTRAVENOUS

## 2023-12-04 MED ORDER — LIDOCAINE 5 % EX PTCH
1.0000 | MEDICATED_PATCH | CUTANEOUS | Status: DC
  Administered 2023-12-04: 1 via TRANSDERMAL
  Filled 2023-12-04: qty 1

## 2023-12-04 MED ORDER — IOHEXOL 300 MG/ML  SOLN
100.0000 mL | Freq: Once | INTRAMUSCULAR | Status: AC | PRN
  Administered 2023-12-04: 100 mL via INTRAVENOUS

## 2023-12-04 MED ORDER — CIPROFLOXACIN HCL 500 MG PO TABS: 500.0000 mg | ORAL_TABLET | Freq: Two times a day (BID) | ORAL | 0 refills | Status: AC
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
## 2023-12-04 NOTE — Discharge Instructions (Signed)
 Your CT scan suggest that you do have some colitis and you are being placed on antibiotics to treat you for this problem.  You will need follow-up with the gastroenterologist and are being referred to Dr. Riley Cheadle for this.  Call his office for an appointment.  Your MRI is negative for acute injury or surgical problems with your spinal cord.   Also please refer to the attachment regarding community resources for assistance with your substance use disorder.
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
## 2023-12-04 NOTE — ED Notes (Signed)
 Pt oriented when awake. Pupils perrla. C/o abd pain/nausea and having to urinate.

## 2023-12-04 NOTE — ED Notes (Signed)
 Patient discharged. Provider spoke to patient. Paperwork given to patient and reviewed. Pt verbalized understanding. VSS. A+Ox4. Patient ambulated out of the ER with steady, independent gait, leaving with [place] [employer]. Iv removed intact without complications.

## 2023-12-04 NOTE — ED Notes (Signed)
 UTA vitals, patient in bathroom.
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
## 2023-12-04 NOTE — ED Notes (Signed)
 Pt was unable to urinate with standing. Bladder scanned and was . PA aware.

## 2023-12-09 LAB — CULTURE, BLOOD (ROUTINE X 2)
Culture: NO GROWTH
Culture: NO GROWTH
Special Requests: ADEQUATE

## 2024-02-20 ENCOUNTER — Emergency Department (HOSPITAL_COMMUNITY)
Admission: EM | Admit: 2024-02-20 | Discharge: 2024-02-20 | Attending: Emergency Medicine | Admitting: Emergency Medicine

## 2024-02-20 ENCOUNTER — Encounter (HOSPITAL_COMMUNITY): Payer: Self-pay

## 2024-02-20 ENCOUNTER — Other Ambulatory Visit: Payer: Self-pay

## 2024-02-20 DIAGNOSIS — Z008 Encounter for other general examination: Secondary | ICD-10-CM

## 2024-02-20 NOTE — Discharge Instructions (Signed)
 Cleared to go back to jail

## 2024-02-20 NOTE — ED Triage Notes (Signed)
 [facility]. Pt tested positive in the jail for Buprenorphine and Benzos. Pt reports he is prescribed these medications and presents in NAD.

## 2024-02-20 NOTE — ED Provider Notes (Signed)
  EMERGENCY DEPARTMENT AT [facility] Provider Note   CSN: [id number] Arrival date & time: 02/20/24  1039     Patient presents with: Medical Clearance   Austin Bryant is a 38 y.o. male.   HPI   This patient is a 38 year old male presenting from jail after he was found to have a drug screen positive for both Suboxone and benzodiazepines neither of which he is actively getting from the jail.  He states that he is getting them from dirty cops.  The patient is not symptomatic at all whatsoever.  He was brought here for clearance given his positive drug screen but has no symptoms whatsoever  Prior to Admission medications   Medication Sig Start Date End Date Taking? Authorizing Provider  buprenorphine-naloxone  (SUBOXONE) 8-2 mg SUBL SL tablet Place under the tongue.   Yes [provider]  QUEtiapine (SEROQUEL) 200 MG tablet Take 200 mg by mouth at bedtime.   Yes [provider]  cefpodoxime  (VANTIN ) 200 MG tablet Take 1 tablet (200 mg total) by mouth 2 (two) times daily. 10/18/23   Ula Prentice JONELLE, MD  celecoxib  (CELEBREX ) 200 MG capsule Take 1 capsule (200 mg total) by mouth 2 (two) times daily. 01/27/21   Harden Jerona GAILS, MD  gabapentin  (NEURONTIN ) 300 MG capsule Take 1 capsule (300 mg total) by mouth 3 (three) times daily. 3 times a day when necessary neuropathy pain 01/27/21   Harden Jerona GAILS, MD  metroNIDAZOLE  (FLAGYL ) 500 MG tablet Take 1 tablet (500 mg total) by mouth 2 (two) times daily. 12/04/23   Suellen Cantor A, PA-C  oxyCODONE -acetaminophen  (PERCOCET/ROXICET) 5-325 MG tablet Take 1 tablet by mouth daily as needed for severe pain. 01/17/21   Harden Jerona GAILS, MD  sulfamethoxazole -trimethoprim  (BACTRIM  DS) 800-160 MG tablet Take 1 tablet by mouth 2 (two) times daily. 07/07/21   Theotis Cameron HERO, PA-C    Allergies: Bee venom, Ibuprofen , Penicillins, and Tape    Review of Systems  All other systems reviewed and are negative.   Updated Vital  Signs BP (!) 125/90 (BP Location: Right Arm)   Pulse 60   Temp 98.5 F (36.9 C) (Oral)   Resp 18   Ht 1.956 m (6' 5)   Wt 102.5 kg   SpO2 100%   BMI 26.80 kg/m   Physical Exam Vitals and nursing note reviewed.  Constitutional:      General: He is not in acute distress.    Appearance: He is well-developed.  HENT:     Head: Normocephalic and atraumatic.     Mouth/Throat:     Pharynx: No oropharyngeal exudate.  Eyes:     General: No scleral icterus.       Right eye: No discharge.        Left eye: No discharge.     Conjunctiva/sclera: Conjunctivae normal.     Pupils: Pupils are equal, round, and reactive to light.  Neck:     Thyroid: No thyromegaly.     Vascular: No JVD.  Cardiovascular:     Rate and Rhythm: Normal rate and regular rhythm.     Heart sounds: Normal heart sounds. No murmur heard.    No friction rub. No gallop.  Pulmonary:     Effort: Pulmonary effort is normal. No respiratory distress.     Breath sounds: Normal breath sounds. No wheezing or rales.  Abdominal:     General: Bowel sounds are normal. There is no distension.     Palpations: Abdomen  is soft. There is no mass.     Tenderness: There is no abdominal tenderness.  Musculoskeletal:        General: No tenderness. Normal range of motion.     Cervical back: Normal range of motion and neck supple.     Right lower leg: No edema.     Left lower leg: No edema.  Lymphadenopathy:     Cervical: No cervical adenopathy.  Skin:    General: Skin is warm and dry.     Findings: No erythema or rash.  Neurological:     Mental Status: He is alert.     Coordination: Coordination normal.  Psychiatric:        Behavior: Behavior normal.     (all labs ordered are listed, but only abnormal results are displayed) Labs Reviewed - No data to display  EKG: None  Radiology: No results found.   Procedures   Medications Ordered in the ED - No data to display                                  Medical  Decision Making  This patient is medically cleared, vitals normal, mental status normal, exam is unremarkable.  Likely getting drugs intermittently on the inside of the jail.  No reason for medical workup or stabilizing care at this time     Final diagnoses:  Medical clearance for incarceration    ED Discharge Orders     None          Cleotilde Rogue, MD 02/20/24 1119
